# Patient Record
Sex: Female | Born: 1975 | Race: White | Hispanic: No | Marital: Single | Smoking: Never smoker
Health system: Southern US, Community
[De-identification: ages and names within clinical notes are randomized; demographics above are authoritative.]

## PROBLEM LIST (undated history)

## (undated) DIAGNOSIS — D259 Leiomyoma of uterus, unspecified: Secondary | ICD-10-CM

## (undated) DIAGNOSIS — Z8742 Personal history of other diseases of the female genital tract: Secondary | ICD-10-CM

## (undated) DIAGNOSIS — N7011 Chronic salpingitis: Secondary | ICD-10-CM

## (undated) DIAGNOSIS — N83202 Unspecified ovarian cyst, left side: Secondary | ICD-10-CM

## (undated) DIAGNOSIS — M1990 Unspecified osteoarthritis, unspecified site: Secondary | ICD-10-CM

## (undated) HISTORY — PX: NO PAST SURGERIES: SHX2092

---

## 2015-01-07 ENCOUNTER — Encounter (HOSPITAL_COMMUNITY): Payer: Self-pay | Admitting: *Deleted

## 2015-01-07 ENCOUNTER — Emergency Department (HOSPITAL_COMMUNITY): Payer: Managed Care, Other (non HMO)

## 2015-01-07 ENCOUNTER — Emergency Department (HOSPITAL_COMMUNITY)
Admission: EM | Admit: 2015-01-07 | Discharge: 2015-01-07 | Disposition: A | Discharge status: Home / Self Care | Payer: Managed Care, Other (non HMO) | Attending: Emergency Medicine | Admitting: Emergency Medicine

## 2015-01-07 DIAGNOSIS — K5900 Constipation, unspecified: Secondary | ICD-10-CM | POA: Insufficient documentation

## 2015-01-07 DIAGNOSIS — D259 Leiomyoma of uterus, unspecified: Secondary | ICD-10-CM

## 2015-01-07 DIAGNOSIS — Z3202 Encounter for pregnancy test, result negative: Secondary | ICD-10-CM | POA: Diagnosis not present

## 2015-01-07 DIAGNOSIS — N83202 Unspecified ovarian cyst, left side: Secondary | ICD-10-CM

## 2015-01-07 DIAGNOSIS — R1031 Right lower quadrant pain: Secondary | ICD-10-CM | POA: Diagnosis present

## 2015-01-07 DIAGNOSIS — N8320 Unspecified ovarian cysts: Secondary | ICD-10-CM | POA: Diagnosis not present

## 2015-01-07 DIAGNOSIS — N83201 Unspecified ovarian cyst, right side: Secondary | ICD-10-CM

## 2015-01-07 LAB — URINALYSIS, ROUTINE W REFLEX MICROSCOPIC
Bilirubin Urine: NEGATIVE
GLUCOSE, UA: NEGATIVE mg/dL
KETONES UR: NEGATIVE mg/dL
Nitrite: NEGATIVE
PH: 5.5 (ref 5.0–8.0)
Protein, ur: NEGATIVE mg/dL
SPECIFIC GRAVITY, URINE: 1.012 (ref 1.005–1.030)
Urobilinogen, UA: 0.2 mg/dL (ref 0.0–1.0)

## 2015-01-07 LAB — CBC WITH DIFFERENTIAL/PLATELET
BASOS ABS: 0 10*3/uL (ref 0.0–0.1)
Basophils Relative: 0 % (ref 0–1)
EOS ABS: 0.2 10*3/uL (ref 0.0–0.7)
EOS PCT: 2 % (ref 0–5)
HEMATOCRIT: 39.5 % (ref 36.0–46.0)
Hemoglobin: 13 g/dL (ref 12.0–15.0)
LYMPHS PCT: 12 % (ref 12–46)
Lymphs Abs: 1.2 10*3/uL (ref 0.7–4.0)
MCH: 30.5 pg (ref 26.0–34.0)
MCHC: 32.9 g/dL (ref 30.0–36.0)
MCV: 92.7 fL (ref 78.0–100.0)
Monocytes Absolute: 0.8 10*3/uL (ref 0.1–1.0)
Monocytes Relative: 8 % (ref 3–12)
Neutro Abs: 8 10*3/uL — ABNORMAL HIGH (ref 1.7–7.7)
Neutrophils Relative %: 78 % — ABNORMAL HIGH (ref 43–77)
Platelets: 305 10*3/uL (ref 150–400)
RBC: 4.26 MIL/uL (ref 3.87–5.11)
RDW: 12.8 % (ref 11.5–15.5)
WBC: 10.3 10*3/uL (ref 4.0–10.5)

## 2015-01-07 LAB — COMPREHENSIVE METABOLIC PANEL
ALT: 19 U/L (ref 0–35)
ANION GAP: 8 (ref 5–15)
AST: 21 U/L (ref 0–37)
Albumin: 3.5 g/dL (ref 3.5–5.2)
Alkaline Phosphatase: 71 U/L (ref 39–117)
BUN: 14 mg/dL (ref 6–23)
CALCIUM: 9 mg/dL (ref 8.4–10.5)
CO2: 25 mmol/L (ref 19–32)
CREATININE: 0.76 mg/dL (ref 0.50–1.10)
Chloride: 105 mmol/L (ref 96–112)
GFR calc Af Amer: >90 mL/min (ref >90)
GFR calc non Af Amer: >90 mL/min (ref >90)
GLUCOSE: 99 mg/dL (ref 70–99)
POTASSIUM: 3.8 mmol/L (ref 3.5–5.1)
Sodium: 138 mmol/L (ref 135–145)
Total Bilirubin: 0.5 mg/dL (ref 0.3–1.2)
Total Protein: 6.7 g/dL (ref 6.0–8.3)

## 2015-01-07 LAB — POC URINE PREG, ED: PREG TEST UR: NEGATIVE

## 2015-01-07 LAB — URINE MICROSCOPIC-ADD ON

## 2015-01-07 LAB — LIPASE, BLOOD: Lipase: 35 U/L (ref 11–59)

## 2015-01-07 MED ORDER — IOHEXOL 300 MG/ML  SOLN
25.0000 mL | Freq: Once | INTRAMUSCULAR | Status: AC | PRN
  Administered 2015-01-07: 25 mL via ORAL

## 2015-01-07 MED ORDER — NAPROXEN 500 MG PO TABS: 500.0000 mg | ORAL_TABLET | Freq: Two times a day (BID) | ORAL | Status: DC | PRN

## 2015-01-07 MED ORDER — SODIUM CHLORIDE 0.9 % IV BOLUS (SEPSIS)
1000.0000 mL | Freq: Once | INTRAVENOUS | Status: AC
  Administered 2015-01-07: 1000 mL via INTRAVENOUS

## 2015-01-07 MED ORDER — MORPHINE SULFATE 4 MG/ML IJ SOLN
4.0000 mg | Freq: Once | INTRAMUSCULAR | Status: AC
  Administered 2015-01-07: 4 mg via INTRAVENOUS
  Filled 2015-01-07: qty 1

## 2015-01-07 MED ORDER — HYDROCODONE-ACETAMINOPHEN 5-325 MG PO TABS: 1.0000 | ORAL_TABLET | Freq: Four times a day (QID) | ORAL | Status: DC | PRN

## 2015-01-07 MED ORDER — IOHEXOL 300 MG/ML  SOLN
100.0000 mL | Freq: Once | INTRAMUSCULAR | Status: AC | PRN
  Administered 2015-01-07: 100 mL via INTRAVENOUS

## 2015-01-07 NOTE — ED Notes (Signed)
Patchy redness noted to R arm after morphine given.  No pruritis or hives.  PA notified.

## 2015-01-07 NOTE — Discharge Instructions (Signed)
Your abdominal pain is likely related to your ovarian cysts. Use naprosyn as directed as needed for pain, and norco as directed as needed for severe pain, but don't drive while taking norco. Follow up with OBGYN in 1 week. Return to the ER for changes or worsening symptoms.  Abdominal (belly) pain can be caused by many things. Your caregiver performed an examination and possibly ordered blood/urine tests and imaging (CT scan, x-rays, ultrasound). Many cases can be observed and treated at home after initial evaluation in the emergency department. Even though you are being discharged home, abdominal pain can be unpredictable. Therefore, you need a repeated exam if your pain does not resolve, returns, or worsens. Most patients with abdominal pain don't have to be admitted to the hospital or have surgery, but serious problems like appendicitis and gallbladder attacks can start out as nonspecific pain. Many abdominal conditions cannot be diagnosed in one visit, so follow-up evaluations are very important. SEEK IMMEDIATE MEDICAL ATTENTION IF YOU DEVELOP ANY OF THE FOLLOWING SYMPTOMS:  The pain does not go away or becomes severe.   A temperature above 101 develops.   Repeated vomiting occurs (multiple episodes).   The pain becomes localized to portions of the abdomen. The right side could possibly be appendicitis. In an adult, the left lower portion of the abdomen could be colitis or diverticulitis.   Blood is being passed in stools or vomit (bright red or black tarry stools).   Return also if you develop chest pain, difficulty breathing, dizziness or fainting, or become confused, poorly responsive, or inconsolable (young children).  The constipation stays for more than 4 days.   There is belly (abdominal) or rectal pain.   You do not seem to be getting better.     Abdominal Pain Many things can cause abdominal pain. Usually, abdominal pain is not caused by a disease and will improve without  treatment. It can often be observed and treated at home. Your health care provider will do a physical exam and possibly order blood tests and X-rays to help determine the seriousness of your pain. However, in many cases, more time must pass before a clear cause of the pain can be found. Before that point, your health care provider may not know if you need more testing or further treatment. HOME CARE INSTRUCTIONS  Monitor your abdominal pain for any changes. The following actions may help to alleviate any discomfort you are experiencing:  Only take over-the-counter or prescription medicines as directed by your health care provider.  Do not take laxatives unless directed to do so by your health care provider.  Try a clear liquid diet (broth, tea, or water) as directed by your health care provider. Slowly move to a bland diet as tolerated. SEEK MEDICAL CARE IF:  You have unexplained abdominal pain.  You have abdominal pain associated with nausea or diarrhea.  You have pain when you urinate or have a bowel movement.  You experience abdominal pain that wakes you in the night.  You have abdominal pain that is worsened or improved by eating food.  You have abdominal pain that is worsened with eating fatty foods.  You have a fever. SEEK IMMEDIATE MEDICAL CARE IF:   Your pain does not go away within 2 hours.  You keep throwing up (vomiting).  Your pain is felt only in portions of the abdomen, such as the right side or the left lower portion of the abdomen.  You pass bloody or black tarry stools. MAKE  SURE YOU:  Understand these instructions.   Will watch your condition.   Will get help right away if you are not doing well or get worse.  Document Released: 07/09/2005 Document Revised: 10/04/2013 Document Reviewed: 06/08/2013 [facility] Patient Information 2015 [place], [place]. This information is not intended to replace advice given to you by your health care provider. Make sure you  discuss any questions you have with your health care provider.  Ovarian Cyst An ovarian cyst is a fluid-filled sac that forms on an ovary. The ovaries are small organs that produce eggs in women. Various types of cysts can form on the ovaries. Most are not cancerous. Many do not cause problems, and they often go away on their own. Some may cause symptoms and require treatment. Common types of ovarian cysts include:  Functional cysts--These cysts may occur every month during the menstrual cycle. This is normal. The cysts usually go away with the next menstrual cycle if the woman does not get pregnant. Usually, there are no symptoms with a functional cyst.  Endometrioma cysts--These cysts form from the tissue that lines the uterus. They are also called "chocolate cysts" because they become filled with blood that turns brown. This type of cyst can cause pain in the lower abdomen during intercourse and with your menstrual period.  Cystadenoma cysts--This type develops from the cells on the outside of the ovary. These cysts can get very big and cause lower abdomen pain and pain with intercourse. This type of cyst can twist on itself, cut off its blood supply, and cause severe pain. It can also easily rupture and cause a lot of pain.  Dermoid cysts--This type of cyst is sometimes found in both ovaries. These cysts may contain different kinds of body tissue, such as skin, teeth, hair, or cartilage. They usually do not cause symptoms unless they get very big.  Theca lutein cysts--These cysts occur when too much of a certain hormone (human chorionic gonadotropin) is produced and overstimulates the ovaries to produce an egg. This is most common after procedures used to assist with the conception of a baby (in vitro fertilization). CAUSES   Fertility drugs can cause a condition in which multiple large cysts are formed on the ovaries. This is called ovarian hyperstimulation syndrome.  A condition called  polycystic ovary syndrome can cause hormonal imbalances that can lead to nonfunctional ovarian cysts. SIGNS AND SYMPTOMS  Many ovarian cysts do not cause symptoms. If symptoms are present, they may include:  Pelvic pain or pressure.  Pain in the lower abdomen.  Pain during sexual intercourse.  Increasing girth (swelling) of the abdomen.  Abnormal menstrual periods.  Increasing pain with menstrual periods.  Stopping having menstrual periods without being pregnant. DIAGNOSIS  These cysts are commonly found during a routine or annual pelvic exam. Tests may be ordered to find out more about the cyst. These tests may include:  Ultrasound.  X-ray of the pelvis.  CT scan.  MRI.  Blood tests. TREATMENT  Many ovarian cysts go away on their own without treatment. Your health care provider may want to check your cyst regularly for 2-3 months to see if it changes. For women in menopause, it is particularly important to monitor a cyst closely because of the higher rate of ovarian cancer in menopausal women. When treatment is needed, it may include any of the following:  A procedure to drain the cyst (aspiration). This may be done using a long needle and ultrasound. It can also be done  through a laparoscopic procedure. This involves using a thin, lighted tube with a tiny camera on the end (laparoscope) inserted through a small incision.  Surgery to remove the whole cyst. This may be done using laparoscopic surgery or an open surgery involving a larger incision in the lower abdomen.  Hormone treatment or birth control pills. These methods are sometimes used to help dissolve a cyst. HOME CARE INSTRUCTIONS   Only take over-the-counter or prescription medicines as directed by your health care provider.  Follow up with your health care provider as directed.  Get regular pelvic exams and Pap tests. SEEK MEDICAL CARE IF:   Your periods are late, irregular, or painful, or they stop.  Your  pelvic pain or abdominal pain does not go away.  Your abdomen becomes larger or swollen.  You have pressure on your bladder or trouble emptying your bladder completely.  You have pain during sexual intercourse.  You have feelings of fullness, pressure, or discomfort in your stomach.  You lose weight for no apparent reason.  You feel generally ill.  You become constipated.  You lose your appetite.  You develop acne.  You have an increase in body and facial hair.  You are gaining weight, without changing your exercise and eating habits.  You think you are pregnant. SEEK IMMEDIATE MEDICAL CARE IF:   You have increasing abdominal pain.  You feel sick to your stomach (nauseous), and you throw up (vomit).  You develop a fever that comes on suddenly.  You have abdominal pain during a bowel movement.  Your menstrual periods become heavier than usual. MAKE SURE YOU:  Understand these instructions.  Will watch your condition.  Will get help right away if you are not doing well or get worse. Document Released: 09/29/2005 Document Revised: 10/04/2013 Document Reviewed: 06/06/2013 [facility] Patient Information 2015 [place], [place]. This information is not intended to replace advice given to you by your health care provider. Make sure you discuss any questions you have with your health care provider.  Fibroids Fibroids are lumps (tumors) that can occur any place in a woman's body. These lumps are not cancerous. Fibroids vary in size, weight, and where they grow. HOME CARE  Do not take aspirin.  Write down the number of pads or tampons you use during your period. Tell your doctor. This can help determine the best treatment for you. GET HELP RIGHT AWAY IF:  You have pain in your lower belly (abdomen) that is not helped with medicine.  You have cramps that are not helped with medicine.  You have more bleeding between or during your period.  You feel lightheaded or pass out  (faint).  Your lower belly pain gets worse. MAKE SURE YOU:  Understand these instructions.  Will watch your condition.  Will get help right away if you are not doing well or get worse. Document Released: 11/01/2010 Document Revised: 12/22/2011 Document Reviewed: 11/01/2010 [facility] Patient Information 2015 [place], [place]. This information is not intended to replace advice given to you by your health care provider. Make sure you discuss any questions you have with your health care provider.

## 2015-01-07 NOTE — ED Provider Notes (Signed)
CSN: [id number]     Arrival date & time 01/07/15  0707 History   First MD Initiated Contact with Patient 01/07/15 [id number]     Chief Complaint  Patient presents with  . Abdominal Pain     (Consider location/radiation/quality/duration/timing/severity/associated sxs/prior Treatment) HPI Comments: Hannah Miles is a 39 y.o. female with a PMHx of uterine fibroids and remote hx of PID, who presents to the ED with complaints of gradually worsening right lower quadrant abdominal pain that began 3 days ago. She reports the pain is 5/10 constantly dull and intermittently stabbing right lower quadrant abdominal pain, radiating into the right thigh, worse with palpation, bumps in the road, and laying on her right side, and improved with 800 mg ibuprofen which she took approximately 1-2 hours ago. States originally before the ibuprofen her pain was an 8/10. She reports that recently she was diagnosed with uterine fibroids, and that she is supposed to follow-up with her OB/GYN for a second ultrasound close to her menses in order to definitively rule out or rule in whether she has a right ovarian cyst or not. Additional symptoms include constipation, stating that she has not had a bowel movement since Friday which is atypical for her, as well as mild nausea. She denies any fevers, chills, chest pain, shortness of breath, vomiting, diarrhea, melena, hematochezia, obstipation, dysuria, hematuria, flank pain, back pain, vaginal bleeding or discharge, rashes, arthralgias, myalgias, numbness, tingling, weakness, sick contacts, recent travel, suspicious food intake, alcohol use, or chronic NSAID use. Denies any prior abdominal surgeries. Last menstrual period was 3/18-24, which she states was longer than normal, but no heavier and no more painful than normal.  Patient is a 39 y.o. female presenting with abdominal pain. The history is provided by the patient. No language interpreter was used.  Abdominal Pain Pain location:   RLQ Pain quality: dull (constantly) and stabbing (intermittently )   Pain radiates to:  R leg Pain severity:  Moderate Onset quality:  Gradual Duration:  3 days Timing:  Constant Progression:  Worsening Chronicity:  New Context: not alcohol use, not previous surgeries, not recent travel, not sick contacts, not suspicious food intake and not trauma   Relieved by:  NSAIDs Worsened by:  Position changes and palpation (laying on R side, bumps in the road) Ineffective treatments:  None tried Associated symptoms: constipation (no BM in 2 days) and nausea   Associated symptoms: no chest pain, no chills, no diarrhea, no dysuria, no fever, no flatus, no hematemesis, no hematochezia, no hematuria, no melena, no shortness of breath, no vaginal bleeding, no vaginal discharge and no vomiting   Risk factors: obesity   Risk factors: no alcohol abuse, has not had multiple surgeries and no NSAID use     No past medical history on file. No past surgical history on file. No family history on file. History  Substance Use Topics  . Smoking status: Not on file  . Smokeless tobacco: Not on file  . Alcohol Use: Not on file   OB History    No data available     Review of Systems  Constitutional: Negative for fever and chills.  Respiratory: Negative for shortness of breath.   Cardiovascular: Negative for chest pain.  Gastrointestinal: Positive for nausea, abdominal pain and constipation (no BM in 2 days). Negative for vomiting, diarrhea, blood in stool, melena, hematochezia, rectal pain, flatus and hematemesis.  Genitourinary: Negative for dysuria, hematuria, flank pain, vaginal bleeding, vaginal discharge and menstrual problem.  Musculoskeletal: Negative for myalgias,  back pain and arthralgias.  Skin: Negative for rash.  Allergic/Immunologic: Negative for immunocompromised state.  Neurological: Negative for weakness and numbness.  Psychiatric/Behavioral: Negative for confusion.   10 Systems  reviewed and are negative for acute change except as noted in the HPI.    Allergies  Review of patient's allergies indicates not on file.  Home Medications   Prior to Admission medications   Not on File   BP 113/51 mmHg  Pulse 73  Temp(Src) 98.3 F (36.8 C) (Oral)  Resp 16  Ht 5\' 2"  (1.575 m)  Wt 198 lb (89.812 kg)  BMI 36.21 kg/m2  SpO2 100% Physical Exam  Constitutional: She is oriented to person, place, and time. Vital signs are normal. She appears well-developed and well-nourished.  Non-toxic appearance. No distress.  Afebrile, nontoxic, NAD  HENT:  Head: Normocephalic and atraumatic.  Mouth/Throat: Oropharynx is clear and moist and mucous membranes are normal.  Eyes: Conjunctivae and EOM are normal. Right eye exhibits no discharge. Left eye exhibits no discharge.  Neck: Normal range of motion. Neck supple.  Cardiovascular: Normal rate, regular rhythm, normal heart sounds and intact distal pulses.  Exam reveals no gallop and no friction rub.   No murmur heard. Pulmonary/Chest: Effort normal and breath sounds normal. No respiratory distress. She has no decreased breath sounds. She has no wheezes. She has no rhonchi. She has no rales.  Abdominal: Soft. Normal appearance and bowel sounds are normal. She exhibits no distension. There is tenderness in the right lower quadrant. There is tenderness at McBurney's point. There is no rigidity, no rebound, no guarding, no CVA tenderness and negative Murphy's sign.    Soft, nondistended, +BS throughout, with TTP in RLQ over mcburney's point, no r/g/r, neg murphy's, neg rovsing's, neg psoas sign, neg foot tap test, no CVA TTP   Musculoskeletal: Normal range of motion.  MAE x4, strength and sensation grossly intact  Neurological: She is alert and oriented to person, place, and time. She has normal strength. No sensory deficit.  Skin: Skin is warm, dry and intact. No rash noted.  Psychiatric: She has a normal mood and affect.  Nursing  note and vitals reviewed.   ED Course  Procedures (including critical care time) Labs Review Labs Reviewed  CBC WITH DIFFERENTIAL/PLATELET - Abnormal; Notable for the following:    Neutrophils Relative % 78 (*)    Neutro Abs 8.0 (*)    All other components within normal limits  URINALYSIS, ROUTINE W REFLEX MICROSCOPIC - Abnormal; Notable for the following:    APPearance CLOUDY (*)    Hgb urine dipstick SMALL (*)    Leukocytes, UA LARGE (*)    All other components within normal limits  URINE MICROSCOPIC-ADD ON - Abnormal; Notable for the following:    Squamous Epithelial / LPF FEW (*)    Bacteria, UA FEW (*)    All other components within normal limits  URINE CULTURE  COMPREHENSIVE METABOLIC PANEL  LIPASE, BLOOD  POC URINE PREG, ED    Imaging Review Ct Abdomen Pelvis W Contrast  01/07/2015   CLINICAL DATA:  Right lower quadrant pain starting 01/04/2015  EXAM: CT ABDOMEN AND PELVIS WITH CONTRAST  TECHNIQUE: Multidetector CT imaging of the abdomen and pelvis was performed using the standard protocol following bolus administration of intravenous contrast.  CONTRAST:  185mL OMNIPAQUE IOHEXOL 300 MG/ML  SOLN  COMPARISON:  None.  FINDINGS: Lung bases are unremarkable. Sagittal images of the spine are unremarkable.  Enhanced liver is unremarkable. No calcified gallstones  are noted within gallbladder. Enhanced pancreas, spleen and adrenal glands are unremarkable.  Enhanced kidneys are symmetrical in size. No hydronephrosis or hydroureter. No aortic aneurysm.  Delayed renal images shows bilateral renal symmetrical excretion. Bilateral visualized proximal ureter is unremarkable.  There is no small bowel obstruction. No ascites or free air. No adenopathy.  No thickened or dilated small bowel loops. There is no pericecal inflammation. Normal retrocecal appendix noted in axial image 54.  There is mild nodular prominence of left anterior uterus fundus measures 3.4 cm highly suspicious for a myometrial  fibroid. Correlation with clinical exam and pelvic ultrasound is recommended. There is a left ovarian cyst measures 5 x 3.9 cm. Small amount of free fluid noted within posterior cul-de-sac. There might be a hemorrhagic follicle within right ovary measures 1.9 cm. There is tubular dilatation of the right fallopian tube measures at least 2.7 cm. 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 Minimal enhancement of right tubal wall. Mild salpingitis cannot be excluded. Clinical correlation is necessary.  No distal colonic obstruction.  IMPRESSION: 1. No acute inflammatory process within abdomen. 2. No small bowel obstruction.  No hydronephrosis or hydroureter. 3. Normal appendix.  No pericecal inflammation. 4. There is mild prominence of left uterine fundus measures 3.4 cm highly suspicious for myometrial fibroid. Correlation with clinical exam and pelvic ultrasound is recommended. 5. There is a left ovarian cyst measures 3.9 x 5 cm. Small amount of free fluid noted posterior cul-de-sac. There is tubular dilatation of right fallopian tube up to 2.7 cm. This is highly suspicious for hydrosalpinx. Minimal prominence of right fallopian wall. Mild salpingitis cannot be excluded. Clinical correlation is necessary. Question hemorrhagic follicle within right ovary measures 1.9 cm.   Electronically Signed   By: Lahoma Crocker M.D.   On: 01/07/2015 09:49     EKG Interpretation None      MDM   Final diagnoses:  RLQ abdominal pain  Cysts of both ovaries  Uterine leiomyoma, unspecified location    39 y.o. female here with RLQ abd pain, gradually worsening since 3 days ago. Diagnosed with fibroids, unsure if she also has ovarian cyst, scheduled for U/S next month. Exam shows RLQ TTP without peritoneal signs, neg foot tap or psoas sign, neg rovsing's, but pain is higher up than pelvic brim, and directly over McBurney's point. Will obtain labs, urine samples, and CT abd to eval for appendicitis vs ovarian cyst vs  obstruction vs other etiology for pain. Pt states she just took ibuprofen, and declines pain meds at this time. Declines nausea meds. Advised pt to let staff know if she needed nausea/pain medications at any time. Will reassess shortly.   10:04 AM Upreg neg, U/A contaminated with leuks, 11-20 WBC, but few bacteria. No UTI symptoms, will send for culture but will not treat today. CBC and CMP WNL. Lipase WNL. CT showing fibroids, L ovarian cyst, R fallopian tube dilation up to 2.7cm, and possible hemorrhagic follicle in R ovary measuring 1.9cm. Pt has hx of PID therefore fallopian tube finding likely from that, no s/sx of PID otherwise. VSS stable, doubt need for emergent intervention of cysts. Tolerating PO well here. Discussed NSAIDs, will give small supply of narcotic pain control, and will have her f/up with GYN. I explained the diagnosis and have given explicit precautions to return to the ER including for any other new or worsening symptoms. The patient understands and accepts the medical plan as it's been dictated and I have answered their questions. Discharge instructions concerning  home care and prescriptions have been given. The patient is STABLE and is discharged to home in good condition.  BP 111/66 mmHg  Pulse 64  Temp(Src) 98.3 F (36.8 C) (Oral)  Resp 18  Ht 5\' 2"  (1.575 m)  Wt 198 lb (89.812 kg)  BMI 36.21 kg/m2  SpO2 98%  LMP 12/29/2014  Meds ordered this encounter  Medications  . sodium chloride 0.9 % bolus 1,000 mL    Sig:   . iohexol (OMNIPAQUE) 300 MG/ML solution 25 mL    Sig:   . iohexol (OMNIPAQUE) 300 MG/ML solution 100 mL    Sig:   . morphine 4 MG/ML injection 4 mg    Sig:   . naproxen (NAPROSYN) 500 MG tablet    Sig: Take 1 tablet (500 mg total) by mouth 2 (two) times daily as needed for mild pain, moderate pain or headache (TAKE WITH MEALS.).    Dispense:  20 tablet    Refill:  0    Order Specific Question:  Supervising Provider    Answer:  MILLER, BRIAN [[id number]]   . HYDROcodone-acetaminophen (NORCO) 5-325 MG per tablet    Sig: Take 1 tablet by mouth every 6 (six) hours as needed for severe pain.    Dispense:  10 tablet    Refill:  0    Order Specific Question:  Supervising Provider    Answer:  Noemi Chapel [[id number]]     Carold Eisner Camprubi-Soms, PA-C 01/07/15 1009  Elnora Morrison, MD 01/08/15 [phone]

## 2015-01-07 NOTE — ED Notes (Signed)
Pt aware of urine sample needed. Pt unable to go at this time. 

## 2015-01-07 NOTE — ED Notes (Signed)
PT here for increasing RLQ pain since Thurs.  States recently dx with fibroids and OBGYN ordered 2nd [place] for poss ovarian cyst to R ovary.  Pt states no bm since Fri, otherwise no changes in bowel or bladder habits.  Some nausea noted.

## 2015-01-07 NOTE — ED Notes (Signed)
No swelling, hives noted at this time.

## 2015-01-07 NOTE — ED Notes (Signed)
CT called and notified pt has finished drinking contrast.

## 2015-01-08 LAB — URINE CULTURE
Colony Count: 60000
Special Requests: NORMAL

## 2015-06-01 ENCOUNTER — Ambulatory Visit (INDEPENDENT_AMBULATORY_CARE_PROVIDER_SITE_OTHER): Payer: Worker's Compensation | Admitting: Family Medicine

## 2015-06-01 VITALS — BP 120/84 | HR 66 | Temp 98.1°F | Resp 16 | Ht 64.0 in | Wt 210.0 lb

## 2015-06-01 DIAGNOSIS — Z3200 Encounter for pregnancy test, result unknown: Secondary | ICD-10-CM

## 2015-06-01 DIAGNOSIS — S29009A Unspecified injury of muscle and tendon of unspecified wall of thorax, initial encounter: Secondary | ICD-10-CM

## 2015-06-01 DIAGNOSIS — S29019A Strain of muscle and tendon of unspecified wall of thorax, initial encounter: Secondary | ICD-10-CM

## 2015-06-01 LAB — POCT URINE PREGNANCY: PREG TEST UR: NEGATIVE

## 2015-06-01 MED ORDER — CYCLOBENZAPRINE HCL 10 MG PO TABS: 10.0000 mg | ORAL_TABLET | Freq: Two times a day (BID) | ORAL | Status: DC | PRN

## 2015-06-01 MED ORDER — IBUPROFEN 800 MG PO TABS: 800.0000 mg | ORAL_TABLET | Freq: Three times a day (TID) | ORAL | Status: DC | PRN

## 2015-06-01 MED ORDER — TRAMADOL HCL 50 MG PO TABS: 50.0000 mg | ORAL_TABLET | Freq: Three times a day (TID) | ORAL | Status: DC | PRN

## 2015-06-01 NOTE — Progress Notes (Signed)
Hannah Miles 1976/08/02 39 y.o.   Chief Complaint  Patient presents with  . Back Injury    upper back on left side      Date of Injury: 05/31/15  History of Present Illness:  Presents for evaluation of work-related complaint. 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 The keg started to tip over, and she grabbed it so it would not fall.  She thought that she was ok, then started to feel a little sore in her back.  She took ibuprofen before bed, woke up around 0300 with pain again, now worse.   The pain is at the left side of her bra line.   No pain or numbness in her arms. Never had any back issues, no history of surgery.  She has NO allergy issues with ibuprofen- tolerates this well ROS  Otherwise well  Allergies  Allergen Reactions  . Aspirin Hives and Swelling    Chest tightness     Current medications reviewed and updated. Past medical history, family history, social history have been reviewed and updated.  LMP 05/09/15   Physical Exam Filed Vitals:   06/01/15 1246  Height: 5\' 4"  (1.626 m)  Weight: 210 lb (95.255 kg)   GEN: WDWN, NAD, Non-toxic, A & O x 3, looks well HEENT: Atraumatic, Normocephalic. Neck supple. No masses, No LAD. Ears and Nose: No external deformity. CV: RRR, No M/G/R. No JVD. No thrill. No extra heart sounds. PULM: CTA B, no wheezes, crackles, rhonchi. No retractions. No resp. distress. No accessory muscle use.Marland Kitchen EXTR: No c/c/e NEURO Normal gait.  PSYCH: Normally interactive. Conversant. Not depressed or anxious appearing.  Calm demeanor.  She has significatn tenderness and spasm in the left thoracic area, no redness, lesion, or cellulitis Normal strength and ROM of BUE.  Normal motion of her spine  Results for orders placed or performed in visit on 06/01/15  POCT urine pregnancy  Result  Value Ref Range   Preg Test, Ur Negative Negative    Assessment and Plan: Thoracic myofascial strain, initial encounter - Plan: traMADol (ULTRAM) 50 MG tablet, cyclobenzaprine (FLEXERIL) 10 MG tablet, ibuprofen (ADVIL,MOTRIN) 800 MG tablet  Encounter for confirmation of pregnancy test result with physical examination - Plan: POCT urine pregnancy  Treat for thoracic strain with ibuprofen 800 and flexeril as needed- also did give an rx for tramadol to use if needed for more severe pain She is cautioned not to combine flexeril with tramadol unless absolutely necessary, and that these medications can cause drowsiness She is taken OOW, and will see [place] on Monday for a recheck of her injury- sooner if any concerns

## 2015-06-01 NOTE — Patient Instructions (Addendum)
We are going to treat you for a thoracic strain with heat, rest, medication Use the flexeril as needed for tight muscles, and tramadol for pain if needed.  You can also use the ibuprofen instead  Both of these medications can make you a bit sleepy- be especially careful if you take them in combination

## 2015-06-04 ENCOUNTER — Ambulatory Visit: Payer: Worker's Compensation | Admitting: Family Medicine

## 2015-06-04 ENCOUNTER — Ambulatory Visit: Payer: Worker's Compensation

## 2015-06-04 ENCOUNTER — Encounter: Payer: Self-pay | Admitting: Family Medicine

## 2015-06-04 VITALS — BP 128/78 | HR 77 | Temp 98.7°F | Resp 16 | Ht 63.0 in | Wt 210.0 lb

## 2015-06-04 DIAGNOSIS — S29019D Strain of muscle and tendon of unspecified wall of thorax, subsequent encounter: Secondary | ICD-10-CM

## 2015-06-04 NOTE — Progress Notes (Signed)
Hannah Miles 1976-02-22 39 y.o.   Chief Complaint  Patient presents with  . Follow-up  . Back Pain     Date of Injury: 05/31/2015  History of Present Illness:  Presents for evaluation of work-related complaint. 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  She was having some pain in her thoracic back, more on the left- we have been treating her with medications including: ibuprofen 800, tramadol, flexeril  At night she takes a flexeril and tramadol.  During the day she is taking ibuprofen, and "if it gets too bad I take the tramadol and the flexeril and I go to sleep." she is sleeping a lot right now as that is when she feels best.   She is using some heat, and epson salts bath.    She is having maybe more pain now- "the only time I'm comfortable is when I'm asleep."  The pain is still mostly in her left upper back No sx down her arm, no numbness or weakness   ROS  Otherwise she is well.    Allergies  Allergen Reactions  . Aspirin Hives and Swelling    Chest tightness     Current medications reviewed and updated. Past medical history, family history, social history have been reviewed and updated.   Physical Exam  GEN: WDWN, NAD, Non-toxic, A & O x 3, looks well HEENT: Atraumatic, Normocephalic. Neck supple. No masses, No LAD. Ears and Nose: No external deformity. CV: RRR, No M/G/R. No JVD. No thrill. No extra heart sounds. PULM: CTA B, no wheezes, crackles, rhonchi. No retractions. No resp. distress. No accessory muscle use. EXTR: No c/c/e NEURO Normal gait.  PSYCH: Normally interactive. Conversant. Not depressed or anxious appearing.  Calm demeanor.  She continues to have tenderness to the left of the thoracic spine adjacent to the scapula Normal BUE strength but she has pain with strength testing of the left arm (pain in her back with use of arm.)  No winging of scapula, no rash, lesion or muscle wasting   [facility] reading  (PRIMARY) by  Dr. Lorelei Pont. Thoracic spine: negative THORACIC SPINE 2 VIEWS  COMPARISON: CT abdomen/ pelvis 01/07/2015  FINDINGS: There is no evidence of thoracic spine fracture. Alignment is normal. No other significant bone abnormalities are identified.  IMPRESSION: Negative.  Assessment and Plan: Thoracic myofascial strain, subsequent encounter - Plan: DG Thoracic Spine 2 View  Here today to recheck WC injury.  She is still hurt, cannot lift and has pain with ADls such as getting dressed.  She is also taking sedating medications.  Continue to have her OOW, she will rtc  For a recheck in 3 days and we hope she will be making good progress at that time

## 2015-06-04 NOTE — Patient Instructions (Signed)
Continue your medications as needed.   Use heat, rest, stretches, and let me know if you are getting worse or have any other concerns Please come and see me on Thursday (at the walk in center, I will be there 8-5) for a recheck We hope that you will be making good progress by then!

## 2015-06-07 ENCOUNTER — Ambulatory Visit (INDEPENDENT_AMBULATORY_CARE_PROVIDER_SITE_OTHER): Payer: Worker's Compensation | Admitting: Family Medicine

## 2015-06-07 VITALS — BP 112/82 | HR 66 | Temp 97.9°F | Resp 18 | Ht 64.0 in | Wt 211.0 lb

## 2015-06-07 DIAGNOSIS — S29019D Strain of muscle and tendon of unspecified wall of thorax, subsequent encounter: Secondary | ICD-10-CM

## 2015-06-07 DIAGNOSIS — S29009D Unspecified injury of muscle and tendon of unspecified wall of thorax, subsequent encounter: Secondary | ICD-10-CM

## 2015-06-07 NOTE — Progress Notes (Signed)
Hannah Miles 12/19/1975 39 y.o.   Chief Complaint  Patient presents with  . Follow-up    back   . Back Injury     Date of Injury: 05/31/2015  History of Present Illness:  Presents for evaluation of work-related complaint.  She injured her upper back at work while trying to prevent a keg from falling on 8/18.  We have seen her twice so far.  At her last visit this past Monday 8/22 she was not doing any better, but xrays were negative as below  THORACIC SPINE 2 VIEWS  COMPARISON: CT abdomen/ pelvis 01/07/2015  FINDINGS: There is no evidence of thoracic spine fracture. Alignment is normal. No other significant bone abnormalities are identified.  IMPRESSION: Negative.  She did have a massage since her last visit which helped a lot.  She noted improvement since her massage.   At this time she feels that she is about 80% improvement.  She is able to do her ADLs again without difficulty.   ROS    Allergies  Allergen Reactions  . Aspirin Hives and Swelling    Chest tightness     Current medications reviewed and updated. Past medical history, family history, social history have been reviewed and updated.   Physical Exam  GEN: WDWN, NAD, Non-toxic, A & O x , overweight, looks well HEENT: Atraumatic, Normocephalic. Neck supple. No masses, No LAD. Ears and Nose: No external deformity. CV: RRR, No M/G/R. No JVD. No thrill. No extra heart sounds. PULM: CTA B, no wheezes, crackles, rhonchi. No retractions. No resp. distress. No accessory muscle use. EXTR: No c/c/e NEURO Normal gait.  PSYCH: Normally interactive. Conversant. Not depressed or anxious appearing.  Calm demeanor.  Formerly present left sided thoracic tenderness is resolved Normal BUE strength and ROM Normal back flexion and extension  Assessment and Plan: Thoracic myofascial strain, subsequent encounter she is much improved RTW at limited duty Recheck here in one week and anticipate she will be able  to do full duty

## 2015-06-07 NOTE — Patient Instructions (Signed)
I am glad that you are feeling better- please see [place] next Thursday for a recheck You can go back to work with restrictions- no lifting more than 30 lbs

## 2015-06-15 ENCOUNTER — Ambulatory Visit (INDEPENDENT_AMBULATORY_CARE_PROVIDER_SITE_OTHER): Payer: Worker's Compensation | Admitting: Physician Assistant

## 2015-06-15 VITALS — BP 120/80 | HR 66 | Temp 97.9°F | Resp 16 | Ht 64.0 in | Wt 213.0 lb

## 2015-06-15 DIAGNOSIS — S29009D Unspecified injury of muscle and tendon of unspecified wall of thorax, subsequent encounter: Secondary | ICD-10-CM | POA: Diagnosis not present

## 2015-06-15 DIAGNOSIS — S29019D Strain of muscle and tendon of unspecified wall of thorax, subsequent encounter: Secondary | ICD-10-CM

## 2015-06-15 MED ORDER — IBUPROFEN 800 MG PO TABS: ORAL_TABLET | ORAL | Status: DC

## 2015-06-17 NOTE — Progress Notes (Signed)
Hannah Miles 1975-12-30 39 y.o.   Chief Complaint  Patient presents with  . Work Related Injury    follow up     Date of Injury: 05/31/2015  History of Present Illness:  Presents for evaluation of work-related complaint.  She injured her upper back at work while trying to prevent a keg from falling on 8/18.  We have seen her three times so far.  At her last visit on she was doing much better.  THORACIC SPINE 2 VIEWS  COMPARISON: CT abdomen/ pelvis 01/07/2015  FINDINGS: There is no evidence of thoracic spine fracture. Alignment is normal. No other significant bone abnormalities are identified.  IMPRESSION: Negative.   Review of Systems  Constitutional: Negative for fever.  Respiratory: Negative for cough.   Cardiovascular: Negative for chest pain.  Gastrointestinal: Negative for nausea.  Musculoskeletal: Positive for myalgias.      Allergies  Allergen Reactions  . Aspirin Hives and Swelling    Chest tightness     Current medications reviewed and updated. Past medical history, family history, social history have been reviewed and updated.   Physical Exam  Blood pressure 120/80, pulse 66, temperature 97.9 F (36.6 C), temperature source Oral, resp. rate 16, height 5\' 4"  (1.626 m), weight 213 lb (96.616 kg), last menstrual period 06/04/2015, SpO2 98 %.   GEN: WDWN, NAD, Non-toxic, A & O x , overweight, looks well HEENT: Atraumatic, Normocephalic. Neck supple. No masses, No LAD. Ears and Nose: No external deformity. CV: RRR, No M/G/R. No JVD. No thrill. No extra heart sounds. PULM: CTA B, no wheezes, crackles, rhonchi. No retractions. No resp. distress. No accessory muscle use. EXTR: No c/c/e NEURO Normal gait.  PSYCH: Normally interactive. Conversant. Not depressed or anxious appearing.  Calm demeanor.  Formerly present left sided thoracic tenderness is resolved Normal BUE strength and ROM Normal back flexion and extension    Assessment and  Plan: Thoracic myofascial strain, subsequent encounter - Plan: ibuprofen (ADVIL,MOTRIN) 800 MG tablet Release to full duty without restriction.  Follow up as needed.

## 2015-08-05 NOTE — Progress Notes (Signed)
  Medical screening examination/treatment/procedure(s) were performed by non-physician practitioner and as supervising physician I was immediately available for consultation/collaboration.     

## 2015-11-16 ENCOUNTER — Other Ambulatory Visit: Payer: Self-pay | Admitting: Obstetrics and Gynecology

## 2015-11-16 DIAGNOSIS — R928 Other abnormal and inconclusive findings on diagnostic imaging of breast: Secondary | ICD-10-CM

## 2015-11-22 ENCOUNTER — Ambulatory Visit
Admission: RE | Admit: 2015-11-22 | Discharge: 2015-11-22 | Disposition: A | Discharge status: Home / Self Care | Payer: Managed Care, Other (non HMO) | Source: Ambulatory Visit | Attending: Obstetrics and Gynecology | Admitting: Obstetrics and Gynecology

## 2015-11-22 DIAGNOSIS — R928 Other abnormal and inconclusive findings on diagnostic imaging of breast: Secondary | ICD-10-CM

## 2016-06-11 ENCOUNTER — Encounter (HOSPITAL_BASED_OUTPATIENT_CLINIC_OR_DEPARTMENT_OTHER): Payer: Self-pay | Admitting: *Deleted

## 2016-06-11 NOTE — Progress Notes (Signed)
NPO AFTER MN.  ARRIVE AT 0600.  NEEDS URINE PREG.  GETTING CBC DONE Thursday 06-12-2016.

## 2016-06-12 DIAGNOSIS — E669 Obesity, unspecified: Secondary | ICD-10-CM | POA: Diagnosis not present

## 2016-06-12 DIAGNOSIS — N7011 Chronic salpingitis: Secondary | ICD-10-CM | POA: Diagnosis not present

## 2016-06-12 DIAGNOSIS — R1909 Other intra-abdominal and pelvic swelling, mass and lump: Secondary | ICD-10-CM | POA: Diagnosis present

## 2016-06-12 DIAGNOSIS — D259 Leiomyoma of uterus, unspecified: Secondary | ICD-10-CM | POA: Diagnosis not present

## 2016-06-12 DIAGNOSIS — Z6837 Body mass index (BMI) 37.0-37.9, adult: Secondary | ICD-10-CM | POA: Diagnosis not present

## 2016-06-12 DIAGNOSIS — N736 Female pelvic peritoneal adhesions (postinfective): Secondary | ICD-10-CM | POA: Diagnosis not present

## 2016-06-12 DIAGNOSIS — M1710 Unilateral primary osteoarthritis, unspecified knee: Secondary | ICD-10-CM | POA: Diagnosis not present

## 2016-06-12 LAB — CBC
HEMATOCRIT: 41.3 % (ref 36.0–46.0)
Hemoglobin: 13.7 g/dL (ref 12.0–15.0)
MCH: 30.2 pg (ref 26.0–34.0)
MCHC: 33.2 g/dL (ref 30.0–36.0)
MCV: 91.2 fL (ref 78.0–100.0)
PLATELETS: 309 10*3/uL (ref 150–400)
RBC: 4.53 MIL/uL (ref 3.87–5.11)
RDW: 13 % (ref 11.5–15.5)
WBC: 6.7 10*3/uL (ref 4.0–10.5)

## 2016-06-16 ENCOUNTER — Encounter (HOSPITAL_BASED_OUTPATIENT_CLINIC_OR_DEPARTMENT_OTHER): Payer: Self-pay | Admitting: Anesthesiology

## 2016-06-16 NOTE — H&P (Signed)
Hannah Miles is an 40 y.o. female. She has been followed for several months by Dr. Ulanda Edison for a 5-6 cm left adnexal mass which appears to be an endometrioma, as well as a serpiginous area on the right c/w possible hydrosalpinx, and several small intramural myomas.  She does not want to remove tho option of fertility, but would like the endometrioma removed.    Pertinent Gynecological History: Last mammogram: abnormal: right breast mass which appears benign Date: 11/2015 Last pap: normal Date: 11/2015 OB History: G1, P0010   Menstrual History: Patient's last menstrual period was 05/31/2016 (exact date).    Past Medical History:  Diagnosis Date  . Arthritis    KNEE  . Cyst of left ovary   . History of pelvic inflammatory disease   . 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 Uterine fibroid     Past Surgical History:  Procedure Laterality Date  . NO PAST SURGERIES      History reviewed. No pertinent family history.  Social History:  reports that she has never smoked. She has never used smokeless tobacco. She reports that she drinks alcohol. She reports that she does not use drugs.  Allergies:  Allergies  Allergen Reactions  . Aspirin Anaphylaxis, Hives and Swelling    Chest tightness    No prescriptions prior to admission.    Review of Systems  Respiratory: Negative.   Cardiovascular: Negative.   Gastrointestinal: Negative.   Genitourinary: Negative.     Height 5\' 4"  (1.626 m), weight 97.5 kg (215 lb), last menstrual period 05/31/2016. Physical Exam  Constitutional: She appears well-developed and well-nourished.  Cardiovascular: Normal rate, regular rhythm and normal heart sounds.   No murmur heard. Respiratory: Effort normal and breath sounds normal. No respiratory distress. She has no wheezes.  GI: Soft.  Genitourinary: Vagina normal.  Genitourinary Comments: Uterus slightly enlarged and irregular Left adnexal fullness, no mass on right    No results found for this  or any previous visit (from the past 24 hour(s)).  No results found.  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  All medical and surgical options have been discussed.  Surgical procedure and risks, as well as chances of relieving her symptoms have all been discussed.  She wants to preserve chances for fertility if possible.  Will admit for open laparoscopy with probable left ovarian cystectomy, possible LSO, possible right tuboplasty.    Thomasena Vandenheuvel D 06/16/2016, 8:26 PM

## 2016-06-16 NOTE — Anesthesia Preprocedure Evaluation (Addendum)
Anesthesia Evaluation  Patient identified by MRN, date of birth, ID band Patient awake    Reviewed: Allergy & Precautions, NPO status , Patient's Chart, lab work & pertinent test results  Airway Mallampati: II  TM Distance: >3 FB Neck ROM: Full    Dental no notable dental hx. (+) Teeth Intact   Pulmonary neg pulmonary ROS,    Pulmonary exam normal breath sounds clear to auscultation       Cardiovascular negative cardio ROS Normal cardiovascular exam Rhythm:Regular Rate:Normal     Neuro/Psych negative neurological ROS  negative psych ROS   GI/Hepatic negative GI ROS, Neg liver ROS,   Endo/Other  Obesity  Renal/GU negative Renal ROS  negative genitourinary   Musculoskeletal  (+) Arthritis , Knee    Abdominal (+) + obese,   Peds  Hematology negative hematology ROS (+)   Anesthesia Other Findings   Reproductive/Obstetrics Left Ovarian cyst  Right Hydrosalphinx                            Lab Results  Component Value Date   WBC 6.7 06/12/2016   HGB 13.7 06/12/2016   HCT 41.3 06/12/2016   MCV 91.2 06/12/2016   PLT 309 06/12/2016    Anesthesia Physical Anesthesia Plan  ASA: II  Anesthesia Plan: General   Post-op Pain Management:    Induction: Intravenous  Airway Management Planned: Oral ETT  Additional Equipment:   Intra-op Plan:   Post-operative Plan: Extubation in OR  Informed Consent: I have reviewed the patients History and Physical, chart, labs and discussed the procedure including the risks, benefits and alternatives for the proposed anesthesia with the patient or authorized representative who has indicated his/her understanding and acceptance.   Dental advisory given  Plan Discussed with: CRNA, Anesthesiologist and Surgeon  Anesthesia Plan Comments:         Anesthesia Quick Evaluation

## 2016-06-17 ENCOUNTER — Encounter (HOSPITAL_BASED_OUTPATIENT_CLINIC_OR_DEPARTMENT_OTHER)
Admission: RE | Disposition: A | Discharge status: Home / Self Care | Payer: Self-pay | Source: Ambulatory Visit | Attending: Obstetrics and Gynecology

## 2016-06-17 ENCOUNTER — Ambulatory Visit (HOSPITAL_BASED_OUTPATIENT_CLINIC_OR_DEPARTMENT_OTHER)
Admission: RE | Admit: 2016-06-17 | Discharge: 2016-06-17 | Disposition: A | Discharge status: Home / Self Care | Payer: Managed Care, Other (non HMO) | Source: Ambulatory Visit | Attending: Obstetrics and Gynecology | Admitting: Obstetrics and Gynecology

## 2016-06-17 ENCOUNTER — Encounter (HOSPITAL_BASED_OUTPATIENT_CLINIC_OR_DEPARTMENT_OTHER): Payer: Self-pay | Admitting: *Deleted

## 2016-06-17 ENCOUNTER — Ambulatory Visit (HOSPITAL_BASED_OUTPATIENT_CLINIC_OR_DEPARTMENT_OTHER): Payer: Managed Care, Other (non HMO) | Admitting: Anesthesiology

## 2016-06-17 DIAGNOSIS — Z6837 Body mass index (BMI) 37.0-37.9, adult: Secondary | ICD-10-CM | POA: Insufficient documentation

## 2016-06-17 DIAGNOSIS — N736 Female pelvic peritoneal adhesions (postinfective): Secondary | ICD-10-CM | POA: Insufficient documentation

## 2016-06-17 DIAGNOSIS — M1710 Unilateral primary osteoarthritis, unspecified knee: Secondary | ICD-10-CM | POA: Insufficient documentation

## 2016-06-17 DIAGNOSIS — E669 Obesity, unspecified: Secondary | ICD-10-CM | POA: Insufficient documentation

## 2016-06-17 DIAGNOSIS — N7011 Chronic salpingitis: Secondary | ICD-10-CM | POA: Insufficient documentation

## 2016-06-17 DIAGNOSIS — D259 Leiomyoma of uterus, unspecified: Secondary | ICD-10-CM | POA: Insufficient documentation

## 2016-06-17 HISTORY — DX: Unspecified osteoarthritis, unspecified site: M19.90

## 2016-06-17 HISTORY — DX: Leiomyoma of uterus, unspecified: D25.9

## 2016-06-17 HISTORY — PX: LAPAROSCOPY: SHX197

## 2016-06-17 HISTORY — DX: Personal history of other diseases of the female genital tract: Z87.42

## 2016-06-17 HISTORY — DX: Unspecified ovarian cyst, left side: N83.202

## 2016-06-17 HISTORY — DX: Chronic salpingitis: N70.11

## 2016-06-17 HISTORY — PX: LAPAROSCOPIC LYSIS OF ADHESIONS: SHX5905

## 2016-06-17 LAB — POCT PREGNANCY, URINE: Preg Test, Ur: NEGATIVE

## 2016-06-17 SURGERY — LAPAROSCOPY OPERATIVE
Anesthesia: General | Site: Abdomen

## 2016-06-17 MED ORDER — FENTANYL CITRATE (PF) 100 MCG/2ML IJ SOLN
INTRAMUSCULAR | Status: DC | PRN
  Administered 2016-06-17: 50 ug via INTRAVENOUS
  Administered 2016-06-17: 100 ug via INTRAVENOUS
  Administered 2016-06-17: 25 ug via INTRAVENOUS
  Administered 2016-06-17: 50 ug via INTRAVENOUS

## 2016-06-17 MED ORDER — SUGAMMADEX SODIUM 200 MG/2ML IV SOLN
INTRAVENOUS | Status: AC
  Filled 2016-06-17: qty 2

## 2016-06-17 MED ORDER — LACTATED RINGERS IR SOLN
Status: DC | PRN
  Administered 2016-06-17: 3000 mL

## 2016-06-17 MED ORDER — ROCURONIUM BROMIDE 10 MG/ML (PF) SYRINGE
PREFILLED_SYRINGE | INTRAVENOUS | Status: DC | PRN
  Administered 2016-06-17 (×3): 10 mg via INTRAVENOUS
  Administered 2016-06-17: 20 mg via INTRAVENOUS
  Administered 2016-06-17: 50 mg via INTRAVENOUS

## 2016-06-17 MED ORDER — MIDAZOLAM HCL 5 MG/5ML IJ SOLN
INTRAMUSCULAR | Status: DC | PRN
  Administered 2016-06-17: 2 mg via INTRAVENOUS

## 2016-06-17 MED ORDER — HYDROMORPHONE HCL 1 MG/ML IJ SOLN
0.2500 mg | INTRAMUSCULAR | Status: DC | PRN
  Filled 2016-06-17: qty 1

## 2016-06-17 MED ORDER — ROCURONIUM BROMIDE 100 MG/10ML IV SOLN: INTRAVENOUS | Status: DC | PRN

## 2016-06-17 MED ORDER — MIDAZOLAM HCL 2 MG/2ML IJ SOLN
INTRAMUSCULAR | Status: AC
  Filled 2016-06-17: qty 2

## 2016-06-17 MED ORDER — BUPIVACAINE HCL (PF) 0.25 % IJ SOLN
INTRAMUSCULAR | Status: DC | PRN
  Administered 2016-06-17: 20 mL

## 2016-06-17 MED ORDER — ROCURONIUM BROMIDE 10 MG/ML (PF) SYRINGE
PREFILLED_SYRINGE | INTRAVENOUS | Status: AC
  Filled 2016-06-17: qty 10

## 2016-06-17 MED ORDER — FENTANYL CITRATE (PF) 250 MCG/5ML IJ SOLN
INTRAMUSCULAR | Status: AC
  Filled 2016-06-17: qty 5

## 2016-06-17 MED ORDER — PROPOFOL 10 MG/ML IV BOLUS
INTRAVENOUS | Status: DC | PRN
  Administered 2016-06-17: 200 mg via INTRAVENOUS

## 2016-06-17 MED ORDER — METOCLOPRAMIDE HCL 5 MG/ML IJ SOLN
INTRAMUSCULAR | Status: AC
Start: 2016-06-17 — End: 2016-06-17
  Filled 2016-06-17: qty 2

## 2016-06-17 MED ORDER — DEXAMETHASONE SODIUM PHOSPHATE 4 MG/ML IJ SOLN
INTRAMUSCULAR | Status: DC | PRN
  Administered 2016-06-17: 10 mg via INTRAVENOUS

## 2016-06-17 MED ORDER — METOCLOPRAMIDE HCL 5 MG/ML IJ SOLN
10.0000 mg | Freq: Once | INTRAMUSCULAR | Status: AC | PRN
  Administered 2016-06-17: 10 mg via INTRAVENOUS
  Filled 2016-06-17: qty 2

## 2016-06-17 MED ORDER — KETOROLAC TROMETHAMINE 30 MG/ML IJ SOLN
INTRAMUSCULAR | Status: AC
  Filled 2016-06-17: qty 1

## 2016-06-17 MED ORDER — SCOPOLAMINE 1 MG/3DAYS TD PT72
MEDICATED_PATCH | TRANSDERMAL | Status: AC
  Filled 2016-06-17: qty 1

## 2016-06-17 MED ORDER — SUGAMMADEX SODIUM 200 MG/2ML IV SOLN
INTRAVENOUS | Status: DC | PRN
  Administered 2016-06-17: 200 mg via INTRAVENOUS

## 2016-06-17 MED ORDER — PROPOFOL 10 MG/ML IV BOLUS
INTRAVENOUS | Status: AC
  Filled 2016-06-17: qty 40

## 2016-06-17 MED ORDER — DEXAMETHASONE SODIUM PHOSPHATE 10 MG/ML IJ SOLN
INTRAMUSCULAR | Status: AC
  Filled 2016-06-17: qty 1

## 2016-06-17 MED ORDER — HYDROCODONE-ACETAMINOPHEN 5-325 MG PO TABS: 1.0000 | ORAL_TABLET | Freq: Four times a day (QID) | ORAL | 0 refills | Status: AC | PRN

## 2016-06-17 MED ORDER — LIDOCAINE 2% (20 MG/ML) 5 ML SYRINGE
INTRAMUSCULAR | Status: AC
  Filled 2016-06-17: qty 5

## 2016-06-17 MED ORDER — LIDOCAINE 2% (20 MG/ML) 5 ML SYRINGE
INTRAMUSCULAR | Status: DC | PRN
  Administered 2016-06-17: 100 mg via INTRAVENOUS

## 2016-06-17 MED ORDER — ONDANSETRON HCL 4 MG/2ML IJ SOLN
INTRAMUSCULAR | Status: DC | PRN
  Administered 2016-06-17: 4 mg via INTRAVENOUS

## 2016-06-17 MED ORDER — LACTATED RINGERS IV SOLN
INTRAVENOUS | Status: DC
  Administered 2016-06-17 (×3): via INTRAVENOUS
  Filled 2016-06-17: qty 1000

## 2016-06-17 MED ORDER — MEPERIDINE HCL 25 MG/ML IJ SOLN
6.2500 mg | INTRAMUSCULAR | Status: DC | PRN
Start: 2016-06-17 — End: 2016-06-17
  Filled 2016-06-17: qty 1

## 2016-06-17 MED ORDER — SCOPOLAMINE 1 MG/3DAYS TD PT72
1.0000 | MEDICATED_PATCH | TRANSDERMAL | Status: DC
  Administered 2016-06-17: 1.5 mg via TRANSDERMAL
  Filled 2016-06-17: qty 1

## 2016-06-17 SURGICAL SUPPLY — 56 items
APPLICATOR COTTON TIP 6IN STRL (MISCELLANEOUS) ×4 IMPLANT
BALLN HASSAN TROCAR (MISCELLANEOUS) ×4 IMPLANT
BENZOIN TINCTURE PRP APPL 2/3 (GAUZE/BANDAGES/DRESSINGS) IMPLANT
BLADE SURG 15 STRL LF DISP TIS (BLADE) ×2 IMPLANT
BLADE SURG 15 STRL SS (BLADE) ×2
CANISTER SUCTION 1200CC (MISCELLANEOUS) IMPLANT
CANISTER SUCTION 2500CC (MISCELLANEOUS) ×4 IMPLANT
CATH ROBINSON RED A/P 16FR (CATHETERS) ×4 IMPLANT
CLOSURE WOUND 1/4X4 (GAUZE/BANDAGES/DRESSINGS)
DRAPE UNDERBUTTOCKS STRL (DRAPE) ×4 IMPLANT
DRSG COVADERM PLUS 2X2 (GAUZE/BANDAGES/DRESSINGS) ×8 IMPLANT
DRSG OPSITE POSTOP 3X4 (GAUZE/BANDAGES/DRESSINGS) IMPLANT
DRSG TELFA 3X8 NADH (GAUZE/BANDAGES/DRESSINGS) ×4 IMPLANT
DURAPREP 26ML APPLICATOR (WOUND CARE) ×4 IMPLANT
ELECT REM PT RETURN 9FT ADLT (ELECTROSURGICAL) ×4
ELECTRODE REM PT RTRN 9FT ADLT (ELECTROSURGICAL) ×2 IMPLANT
GLOVE BIO SURGEON STRL SZ8 (GLOVE) IMPLANT
GLOVE ORTHO TXT STRL SZ7.5 (GLOVE) ×4 IMPLANT
GLOVE SURG ORTHO 8.0 STRL STRW (GLOVE) ×8 IMPLANT
GOWN STRL REUS W/ TWL LRG LVL3 (GOWN DISPOSABLE) ×2 IMPLANT
GOWN STRL REUS W/ TWL XL LVL3 (GOWN DISPOSABLE) ×2 IMPLANT
GOWN STRL REUS W/TWL LRG LVL3 (GOWN DISPOSABLE) ×6 IMPLANT
GOWN STRL REUS W/TWL XL LVL3 (GOWN DISPOSABLE) ×6 IMPLANT
KIT ROOM TURNOVER WOR (KITS) ×4 IMPLANT
LIQUID BAND (GAUZE/BANDAGES/DRESSINGS) ×4 IMPLANT
MANIFOLD NEPTUNE II (INSTRUMENTS) IMPLANT
MANIPULATOR UTERINE 4.5 ZUMI (MISCELLANEOUS) IMPLANT
NEEDLE HYPO 25X1 1.5 SAFETY (NEEDLE) IMPLANT
NEEDLE INSUFFLATION 120MM (ENDOMECHANICALS) ×4 IMPLANT
NEEDLE INSUFFLATION 14GA 120MM (NEEDLE) ×4 IMPLANT
NEEDLE INSUFFLATION 14GA 150MM (NEEDLE) IMPLANT
NS IRRIG 500ML POUR BTL (IV SOLUTION) ×4 IMPLANT
PACK BASIN DAY SURGERY FS (CUSTOM PROCEDURE TRAY) ×4 IMPLANT
PACK LAPAROSCOPY II (CUSTOM PROCEDURE TRAY) ×4 IMPLANT
PAD OB MATERNITY 4.3X12.25 (PERSONAL CARE ITEMS) ×4 IMPLANT
PAD PREP 24X48 CUFFED NSTRL (MISCELLANEOUS) ×4 IMPLANT
PADDING ION DISPOSABLE (MISCELLANEOUS) ×4 IMPLANT
POUCH SPECIMEN RETRIEVAL 10MM (ENDOMECHANICALS) IMPLANT
SCISSORS LAP 5X35 DISP (ENDOMECHANICALS) IMPLANT
SET IRRIG TUBING LAPAROSCOPIC (IRRIGATION / IRRIGATOR) ×4 IMPLANT
SHEARS HARMONIC ACE PLUS 36CM (ENDOMECHANICALS) ×4 IMPLANT
SOLUTION ANTI FOG 6CC (MISCELLANEOUS) ×4 IMPLANT
SOLUTION ELECTROLUBE (MISCELLANEOUS) IMPLANT
STRIP CLOSURE SKIN 1/4X4 (GAUZE/BANDAGES/DRESSINGS) IMPLANT
SUT VICRYL 0 UR6 27IN ABS (SUTURE) ×4 IMPLANT
SUT VICRYL 4-0 PS2 18IN ABS (SUTURE) ×4 IMPLANT
SYR 3ML 23GX1 SAFETY (SYRINGE) IMPLANT
SYR CONTROL 10ML LL (SYRINGE) ×4 IMPLANT
SYRINGE 10CC LL (SYRINGE) ×4 IMPLANT
TOWEL OR 17X24 6PK STRL BLUE (TOWEL DISPOSABLE) ×8 IMPLANT
TRAY DSU PREP LF (CUSTOM PROCEDURE TRAY) ×4 IMPLANT
TROCAR XCEL NON-BLD 11X100MML (ENDOMECHANICALS) ×4 IMPLANT
TROCAR XCEL NON-BLD 5MMX100MML (ENDOMECHANICALS) ×4 IMPLANT
TUBING INSUFFLATION 10FT LAP (TUBING) ×4 IMPLANT
WARMER LAPAROSCOPE (MISCELLANEOUS) ×4 IMPLANT
WATER STERILE IRR 500ML POUR (IV SOLUTION) IMPLANT

## 2016-06-17 NOTE — Anesthesia Postprocedure Evaluation (Signed)
Anesthesia Post Note  Patient: Hannah Miles  Procedure(s) Performed: Procedure(s) (LRB): LAPAROSCOPY OPERATIVE (N/A) LAPAROSCOPIC LYSIS OF ADHESIONS (N/A)  Patient location during evaluation: PACU Anesthesia Type: General Level of consciousness: awake and alert Pain management: pain level controlled Vital Signs Assessment: post-procedure vital signs reviewed and stable Respiratory status: spontaneous breathing, nonlabored ventilation and respiratory function stable Cardiovascular status: blood pressure returned to baseline and stable Postop Assessment: no signs of nausea or vomiting Anesthetic complications: no    Last Vitals:  Vitals:   06/17/16 0930 06/17/16 0945  BP: 130/79 115/78  Pulse: 80 70  Resp: 13 17  Temp:      Last Pain:  Vitals:   06/17/16 0608  TempSrc: Oral                 Asmara Backs A.

## 2016-06-17 NOTE — Transfer of Care (Signed)
Immediate Anesthesia Transfer of Care Note  Patient: Hannah Miles  Procedure(s) Performed: Procedure(s): LAPAROSCOPY OPERATIVE (N/A) LAPAROSCOPIC LYSIS OF ADHESIONS (N/A)  Patient Location: PACU  Anesthesia Type:General  Level of Consciousness: awake, alert  and oriented  Airway & Oxygen Therapy: Patient Spontanous Breathing and Patient connected to nasal cannula oxygen  Post-op Assessment: Report given to RN  Post vital signs: Reviewed and stable  Last Vitals:130/79, 75, 15, 100%, 97.6  Vitals:   06/17/16 0608  BP: 124/76  Pulse: 70  Resp: 16  Temp: 36.4 C    Last Pain:  Vitals:   06/17/16 0608  TempSrc: Oral      Patients Stated Pain Goal: 7 ([phone] [phone])  Complications: No apparent anesthesia complications

## 2016-06-17 NOTE — Op Note (Signed)
Preoperative diagnosis: Left ovarian cyst, possible right hydrosalpinx Postoperative diagnosis: Same, significant pelvic adhesions, left hydrosalpinx Procedure: Open laparoscopy with significant adhesiolysis  Surgeon: Cheri Fowler M.D.  Assistant:  Crawford Givens, D.O. Anesthesia: Gen. Endotracheal tube  Findings: She had a normal upper abdomen, uterus with multiple 2-3 cm exophytic myomas, significant pelvic adhesions with dilated tubes, no significant pelvic mas   Specimens: None  Estimated blood loss: 0000000 Complications: None  Procedure in detail:   The patient was taken to the operating room and placed in the dorsosupine position. General anesthesia was induced. Her legs were placed in mobile stirrups and her left arm was tucked to her side. Abdomen perineum and vagina were then prepped and draped in the usual sterile fashion, bladder drained with a red Robinson catheter, an Acorn tenaculum was applied to the cervix for uterine manipulation. Infraumbilical skin was then infiltrated with quarter percent Marcaine and a 3 cm horizontal incision was made. Blunt dissection to the fascia was carried out, the fascia was grasped and elevated and incised with scissors.  Peritoneum was then grasped, elevated and entered sharply.  A pursestring suture of 0-Vicryl was placed around the fascia and a Hasson cannula was inserted and secured.  The abdomen was insufflated with CO2 and adequate visualization was achieved. A 5 mm port was then placed on the left  And right sides also under direct visualization. Careful and thorough inspection revealed the above-mentioned findings.  Using the Harmonic scalpel Ace, adhesions were taken down carefully to avoid any bowel.  There was bowel adherent to the posterior left uterus and in the posterior culdesac.  I called for a surgical consult to evaluate the feasibility of taking down the adhesions that involved bowel, but after 20 minutes with no response I decided to not  pursue that any further.  The pelvis was irrigated, no significant bleeding.  I was not able to identify what we thought pre-operatively was a large left ovarian cyst.  Since she was not having significant pain, I did not think it was reasonable to pursue removal of her left tube and ovary, and neither tube was amenable to tuboplasty.  The 5 mm ports were removed under direct visualization. All gas was allowed to deflate from the abdomen and the umbilical trocar was removed. The pursestring suture was tied with good fascial closure.  Skin incisions were then closed with subcuticular sutures of 4-0 Vicryl followed by Dermabond. The Acorn tenaculum was removed. The patient was taken down from stirrups. She was awakened in the operating room and taken to the recovery room in stable condition after tolerating the procedure well. Counts were correct and she had PAS hose on throughout the procedure.

## 2016-06-17 NOTE — Interval H&P Note (Signed)
History and Physical Interval Note:  06/17/2016 7:09 AM  Hannah Miles  has presented today for surgery, with the diagnosis of Left Ovarian Cyst and Right Hydrosalpinx  The various methods of treatment have been discussed with the patient and family. After consideration of risks, benefits and other options for treatment, the patient has consented to  Procedure(s): LAPAROSCOPIC OVARIAN CYSTECTOMY (Left) SALPINGO OOPHORECTOMY, POSSIBLE RIGHT TUBOPLASTY (Left) as a surgical intervention .  The patient's history has been reviewed, patient examined, no change in status, stable for surgery.  I have reviewed the patient's chart and labs.  Questions were answered to the patient's satisfaction.     Cherisse Carrell D

## 2016-06-17 NOTE — Discharge Instructions (Signed)
HOME CARE INSTRUCTIONS - LAPAROSCOPY  Wound Care: The bandaids or dressing which are placed over the skin openings may be removed the day after surgery. The incision should be kept clean and dry. The stitches do not need to be removed. Should the incision become sore, red, and swollen after the first week, check with your doctor.  Personal Hygiene: Shower the day after your procedure. Always wipe from front to back after elimination.   Activity: Do not drive or operate any equipment today. The effects of the anesthesia are still present and drowsiness may result. Rest today, not necessarily flat bed rest, just take it easy. You may resume your normal activity in one to three days or as instructed by your physician.  Sexual Activity: You resume sexual activity as indicated by your physician_________. If your laparoscopy was for a sterilization ( tubes tied ), continue current method of birth control until after your next period or ask for specific instructions from your doctor.  Diet: Eat a light diet as desired this evening. You may resume a regular diet tomorrow.  Return to Work: Two to three days or as indicated by your doctor.  Expectations After Surgery: Your surgery will cause vaginal drainage or spotting which may continue for 2-3 days. Mild abdominal discomfort or tenderness is not unusual and some shoulder pain may also be noted which can be relieved by lying flat in pain.  Call Your Doctor If these Occur:  Persistent or heavy bleeding at incision site       Redness or swelling around incision       Elevation of temperature greater than 100 degrees F  Call for follow-up appointment _____________.   Post Anesthesia Home Care Instructions  Activity: Get plenty of rest for the remainder of the day. A responsible adult should stay with you for 24 hours following the procedure.  For the next 24 hours, DO NOT: -Drive a car -Paediatric nurse -Drink alcoholic beverages -Take any  medication unless instructed by your physician -Make any legal decisions or sign important papers.  Meals: Start with liquid foods such as gelatin or soup. Progress to regular foods as tolerated. Avoid greasy, spicy, heavy foods. If nausea and/or vomiting occur, drink only clear liquids until the nausea and/or vomiting subsides. Call your physician if vomiting continues.  Special Instructions/Symptoms: Your throat may feel dry or sore from the anesthesia or the breathing tube placed in your throat during surgery. If this causes discomfort, gargle with warm salt water. The discomfort should disappear within 24 hours.  If you had a scopolamine patch placed behind your ear for the management of post- operative nausea and/or vomiting:  1. The medication in the patch is effective for 72 hours, after which it should be removed.  Wrap patch in a tissue and discard in the trash. Wash hands thoroughly with soap and water. 2. You may remove the patch earlier than 72 hours if you experience unpleasant side effects which may include dry mouth, dizziness or visual disturbances. 3. Avoid touching the patch. Wash your hands with soap and water after contact with the patch.   Routine instructions for laparoscopy

## 2016-06-17 NOTE — Anesthesia Procedure Notes (Signed)
Procedure Name: Intubation Date/Time: 06/17/2016 7:33 AM Performed by: Bethena Roys T Pre-anesthesia Checklist: Patient identified, Emergency Drugs available, Suction available and Patient being monitored Patient Re-evaluated:Patient Re-evaluated prior to inductionOxygen Delivery Method: Circle system utilized Preoxygenation: Pre-oxygenation with 100% oxygen Intubation Type: IV induction Ventilation: Mask ventilation without difficulty Laryngoscope Size: Mac and 3 Tube type: Oral Tube size: 7.0 mm Number of attempts: 1 Airway Equipment and Method: Stylet and Oral airway Placement Confirmation: ETT inserted through vocal cords under direct vision,  positive ETCO2 and breath sounds checked- equal and bilateral Secured at: 22 cm Tube secured with: Tape Dental Injury: Teeth and Oropharynx as per pre-operative assessment

## 2016-06-18 ENCOUNTER — Encounter (HOSPITAL_BASED_OUTPATIENT_CLINIC_OR_DEPARTMENT_OTHER): Payer: Self-pay | Admitting: Obstetrics and Gynecology

## 2016-09-17 IMAGING — CT CT ABD-PELV W/ CM
2 of 5 series · 15 of 46 positions shown, 17 images · IV contrast (Omni 300)
Comparison: None.

CLINICAL DATA: Right lower quadrant pain starting 01/04/2015

EXAM:
CT ABDOMEN AND PELVIS WITH CONTRAST
TECHNIQUE: Multidetector CT imaging of the abdomen and pelvis was performed
using the standard protocol following bolus administration of
intravenous contrast.
CONTRAST:  100mL OMNIPAQUE IOHEXOL 300 MG/ML  SOLN

[Series 2: abd/ pelvis 5.0 i30f 1 · axial · 0.87mm/px · z∈[+771,+1231]mm · 12 of 102 slices shown, 14 images]
[im 5/102  soft-tissue]
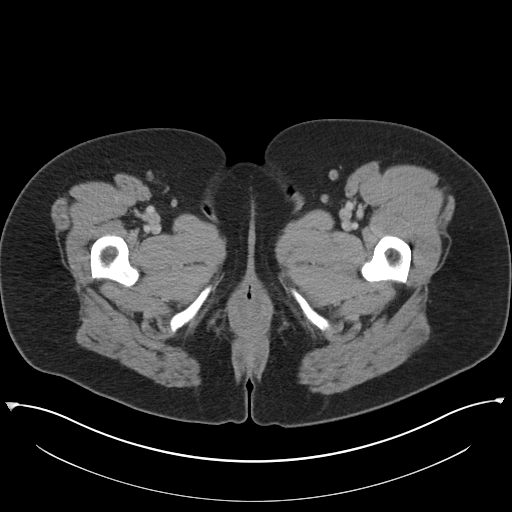
[im 5/102  bone]
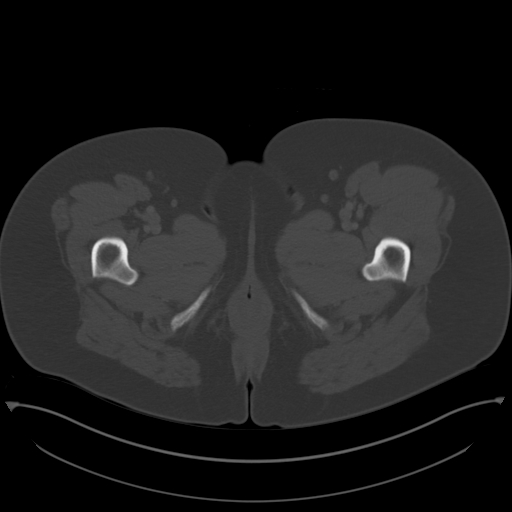
[im 15/102  soft-tissue]
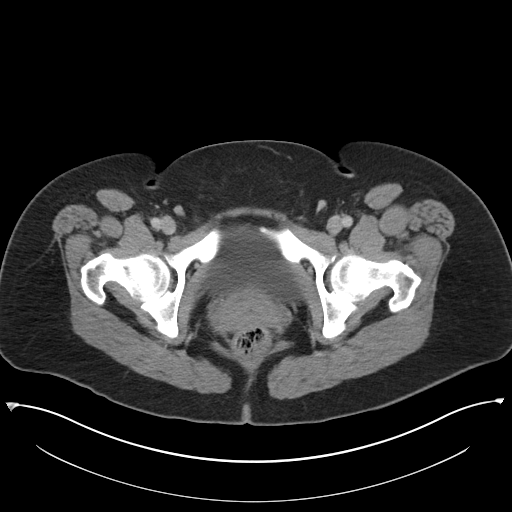
[im 25/102  soft-tissue]
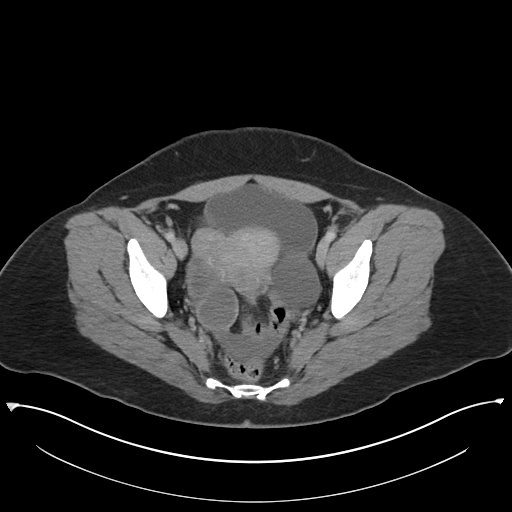
[im 29/102  soft-tissue]
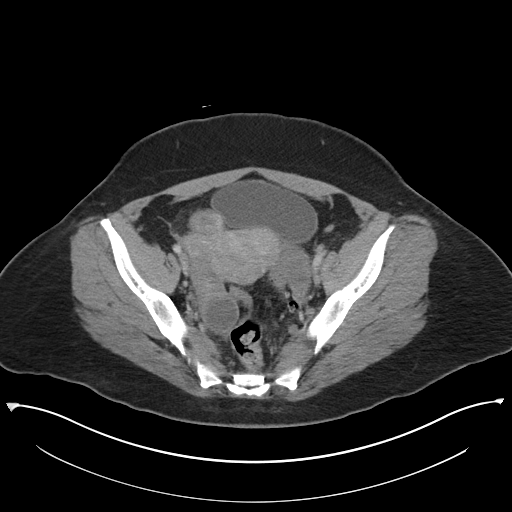
[im 39/102  soft-tissue]
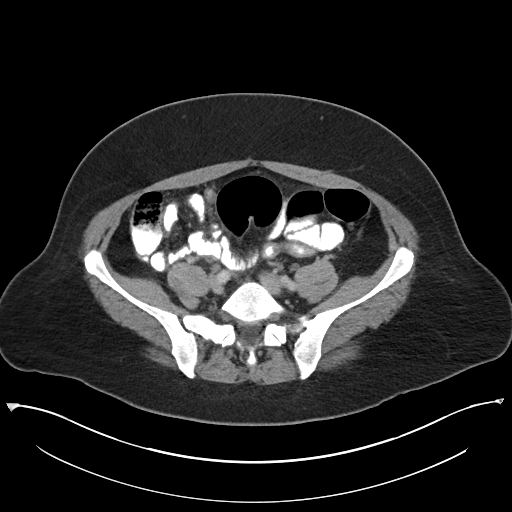
[im 49/102  soft-tissue]
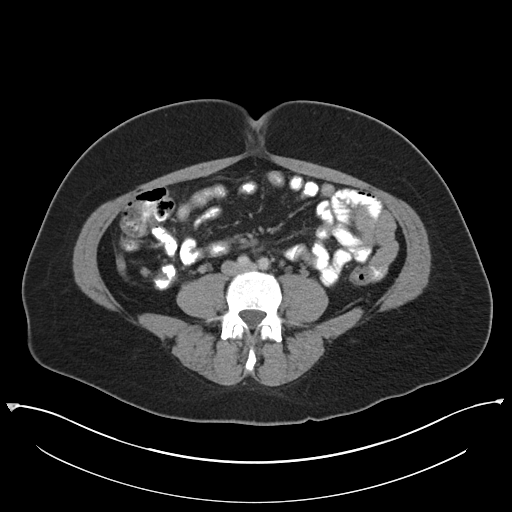
[im 53/102  soft-tissue]
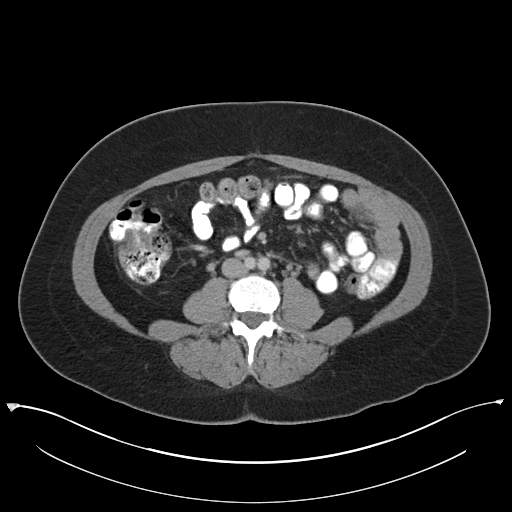
[im 63/102  soft-tissue]
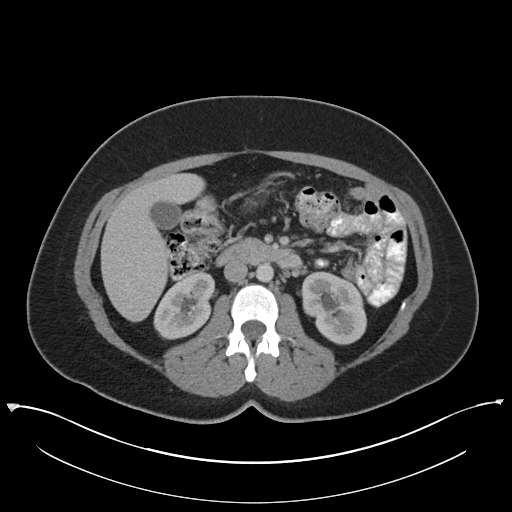
[im 73/102  soft-tissue]
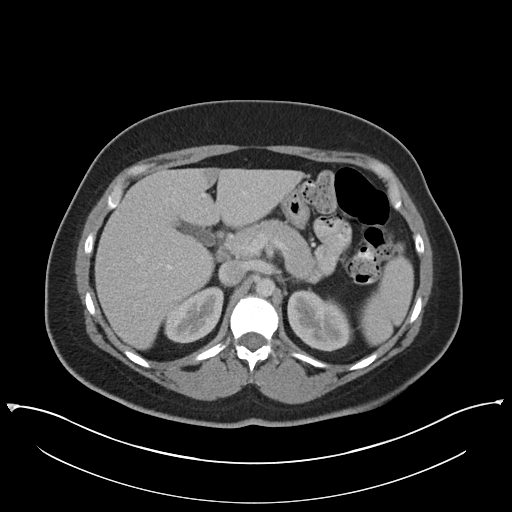
[im 73/102  bone]
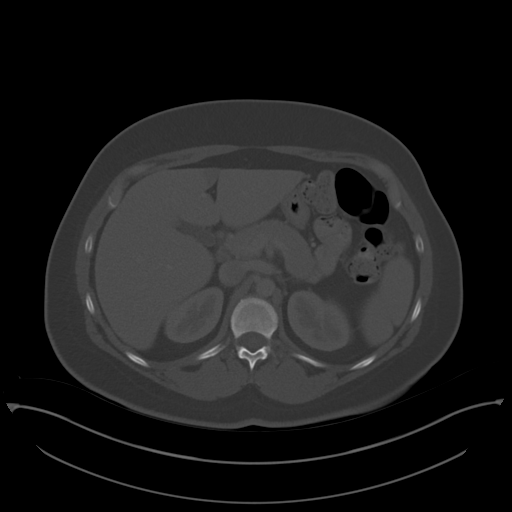
[im 77/102  soft-tissue]
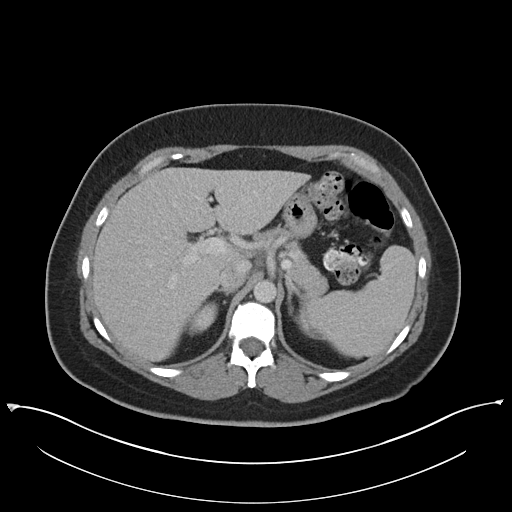
[im 87/102  soft-tissue]
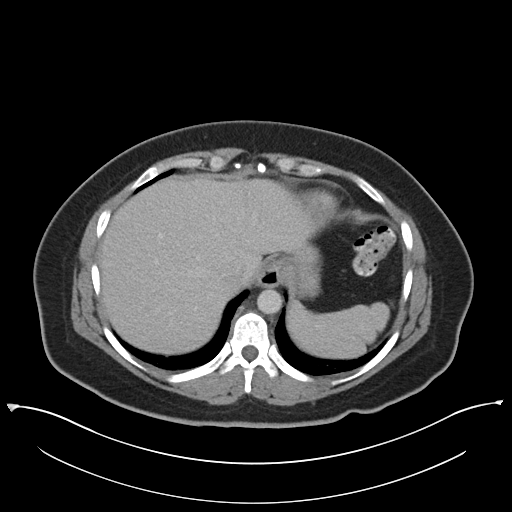
[im 97/102  soft-tissue]
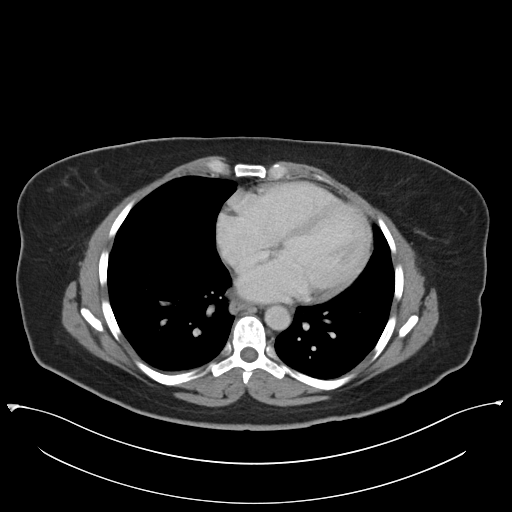

[Series 5: coronals · coronal · 0.79mm/px · 3 of 151 slices shown]
[im 51/151  soft-tissue]
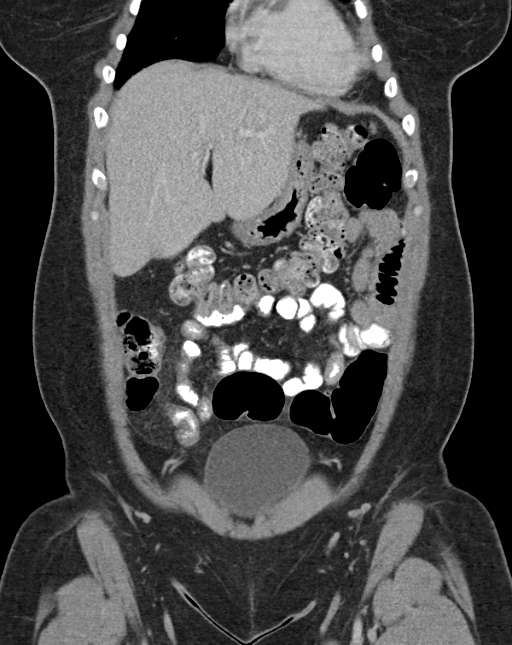
[im 67/151  soft-tissue]
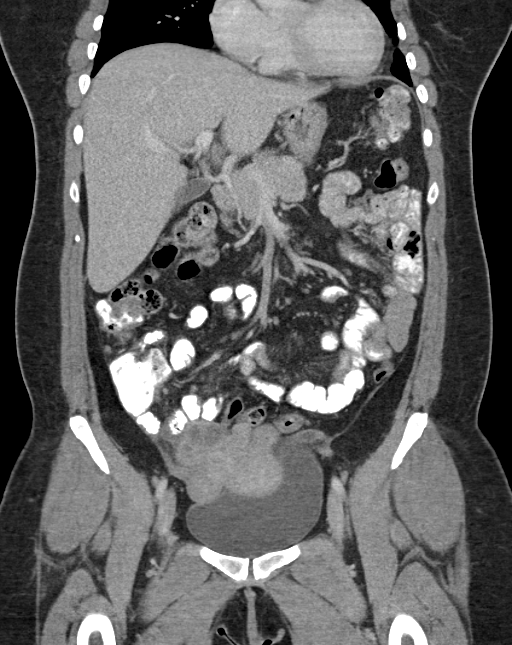
[im 84/151  soft-tissue]
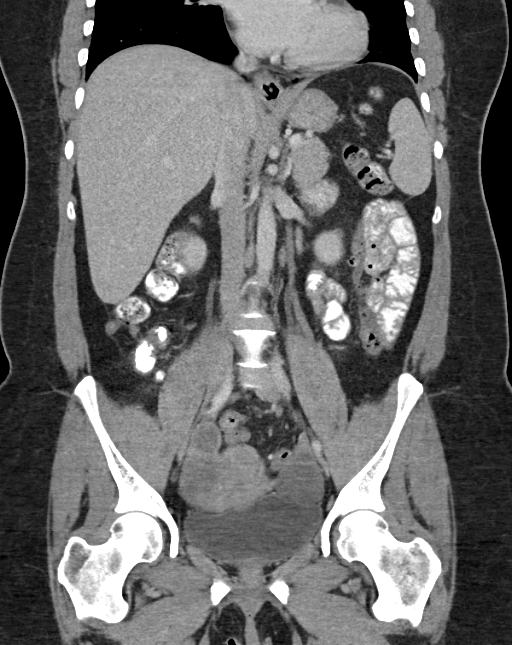

[15 of 46 positions shown; findings below may reference images not displayed]

FINDINGS: Lung bases are unremarkable. Sagittal images of the spine are
unremarkable.

Enhanced liver is unremarkable. No calcified gallstones are noted
within gallbladder. Enhanced pancreas, spleen and adrenal glands are
unremarkable.

Enhanced kidneys are symmetrical in size. No hydronephrosis or
hydroureter. No aortic aneurysm.

Delayed renal images shows bilateral renal symmetrical excretion.
Bilateral visualized proximal ureter is unremarkable.

There is no small bowel obstruction. No ascites or free air. No
adenopathy.

No thickened or dilated small bowel loops. There is no pericecal
inflammation. Normal retrocecal appendix noted in axial image 54.

There is mild nodular prominence of left anterior uterus fundus
measures 3.4 cm highly suspicious for a myometrial fibroid.
Correlation with clinical exam and pelvic ultrasound is recommended.
There is a left ovarian cyst measures 5 x 3.9 cm. Small amount of
free fluid noted within posterior cul-de-sac. There might be a
hemorrhagic follicle within right ovary measures 1.9 cm. There is
tubular dilatation of the right fallopian tube measures at least
cm. Findings are highly suspicious for hydrosalpinx. Minimal
enhancement of right tubal wall. Mild salpingitis cannot be
excluded. Clinical correlation is necessary.

No distal colonic obstruction.
IMPRESSION: 1. No acute inflammatory process within abdomen.
2. No small bowel obstruction.  No hydronephrosis or hydroureter.
3. Normal appendix.  No pericecal inflammation.
4. There is mild prominence of left uterine fundus measures 3.4 cm
highly suspicious for myometrial fibroid. Correlation with clinical
exam and pelvic ultrasound is recommended.
5. There is a left ovarian cyst measures 3.9 x 5 cm. Small amount of
free fluid noted posterior cul-de-sac. There is tubular dilatation
of right fallopian tube up to 2.7 cm. This is highly suspicious for
hydrosalpinx. Minimal prominence of right fallopian wall. Mild
salpingitis cannot be excluded. Clinical correlation is necessary.
Question hemorrhagic follicle within right ovary measures 1.9 cm.

## 2016-11-28 ENCOUNTER — Other Ambulatory Visit: Payer: Self-pay | Admitting: Obstetrics and Gynecology

## 2016-11-28 DIAGNOSIS — N6310 Unspecified lump in the right breast, unspecified quadrant: Secondary | ICD-10-CM

## 2017-02-12 IMAGING — CR DG THORACIC SPINE 2V
3 series · 3 of 3 positions shown · non-contrast
Comparison: CT abdomen/ pelvis 01/07/2015

CLINICAL DATA: Back pain after lifting a heavy object

EXAM:
THORACIC SPINE 2 VIEWS

[lateral (1 of 2)]
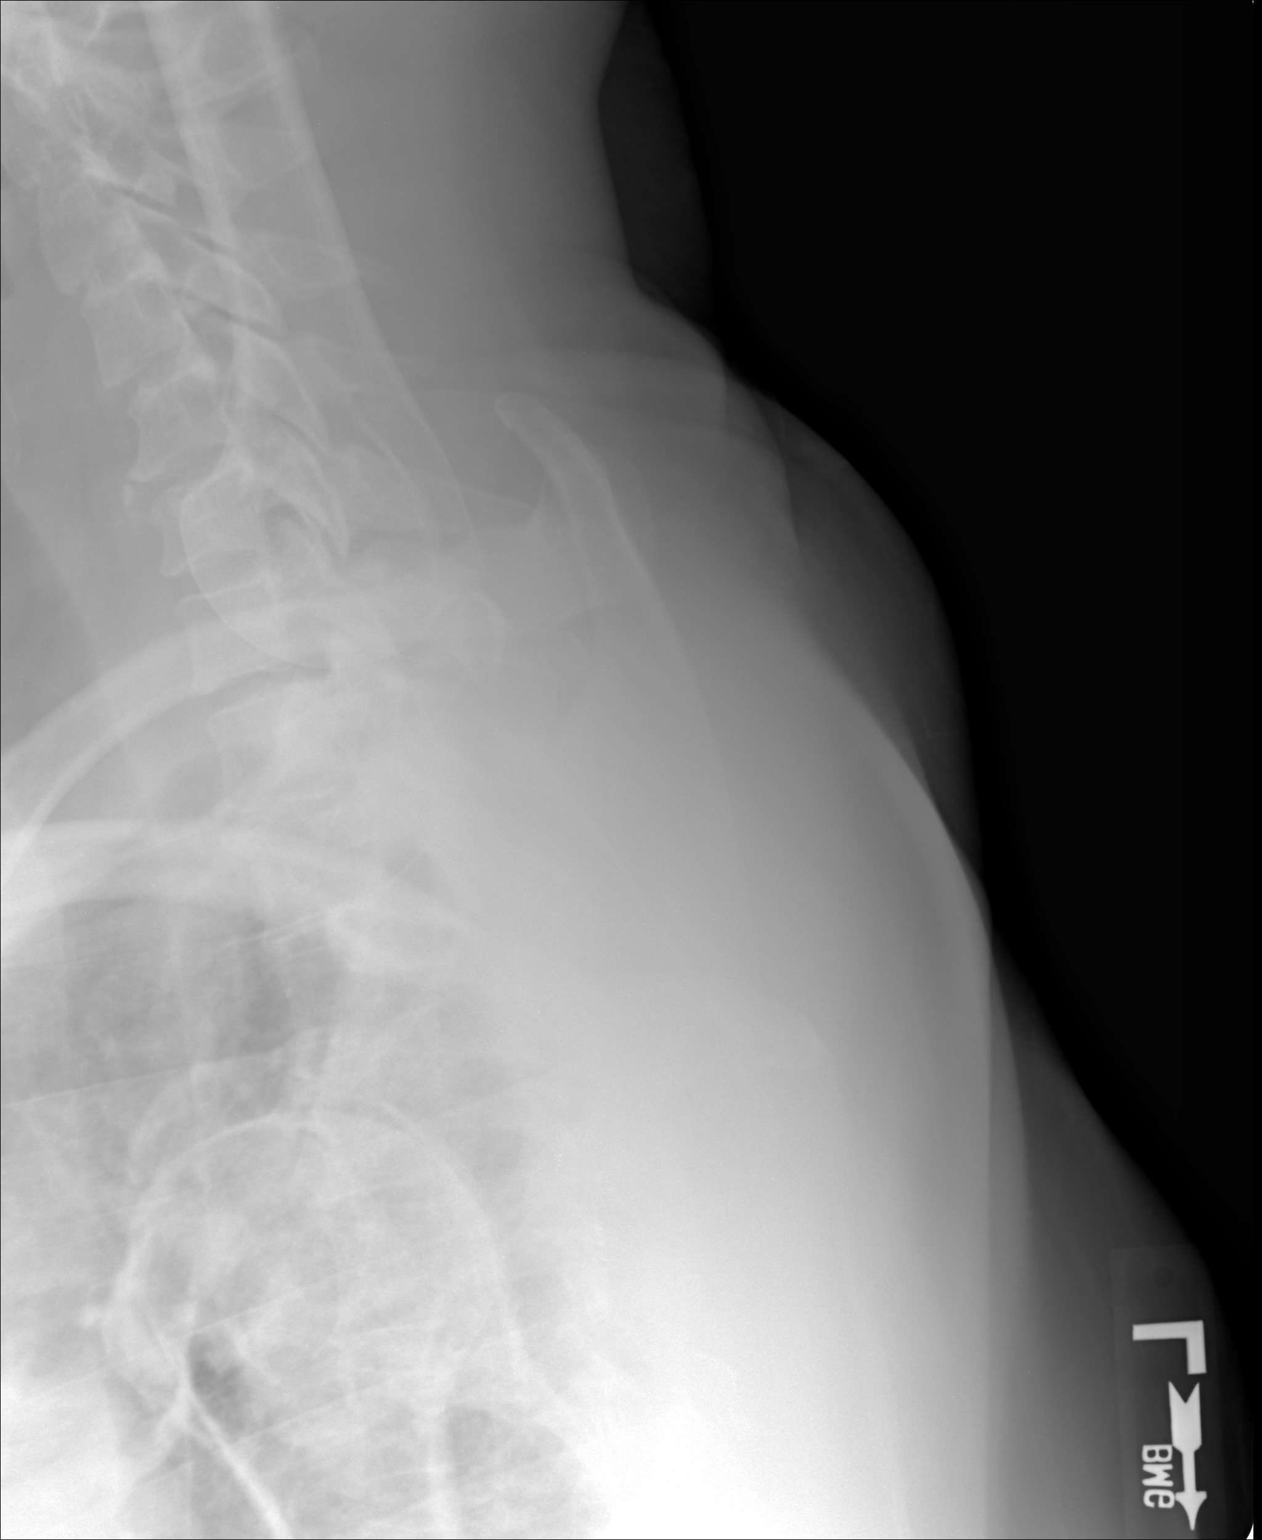

[lateral (2 of 2)]
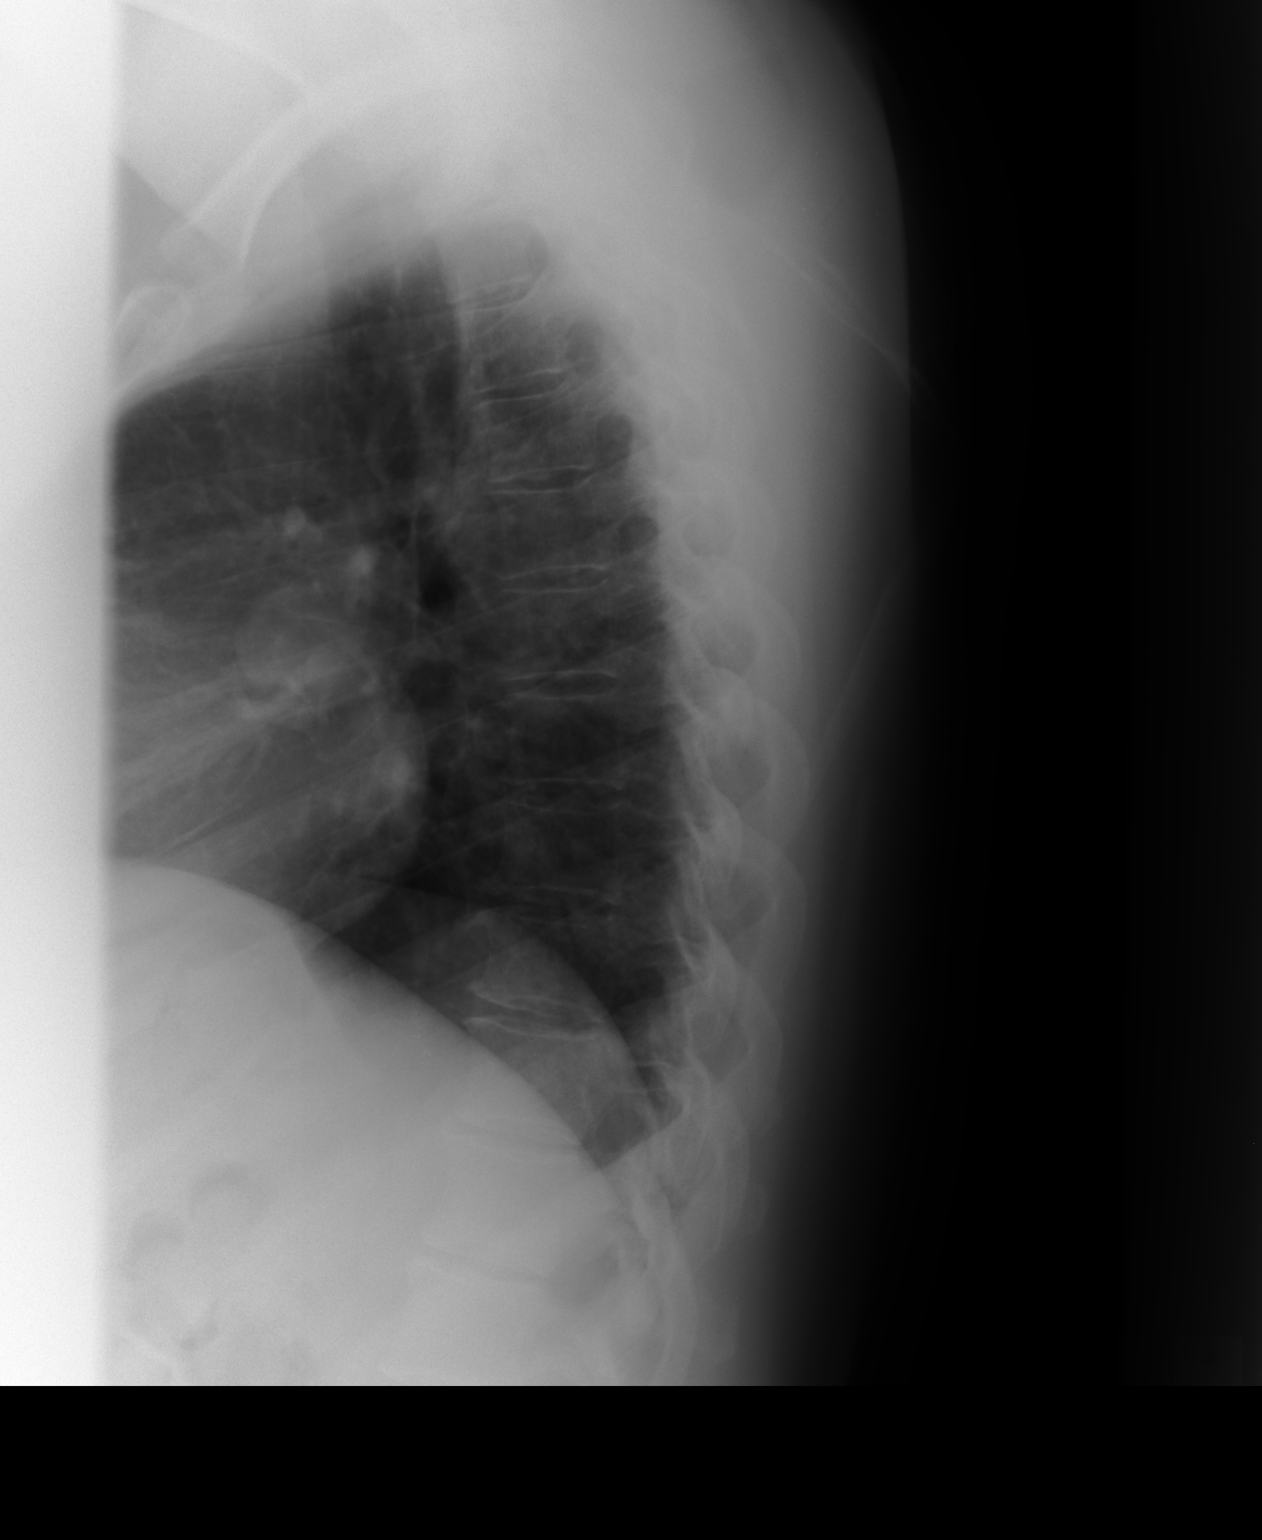

[AP]
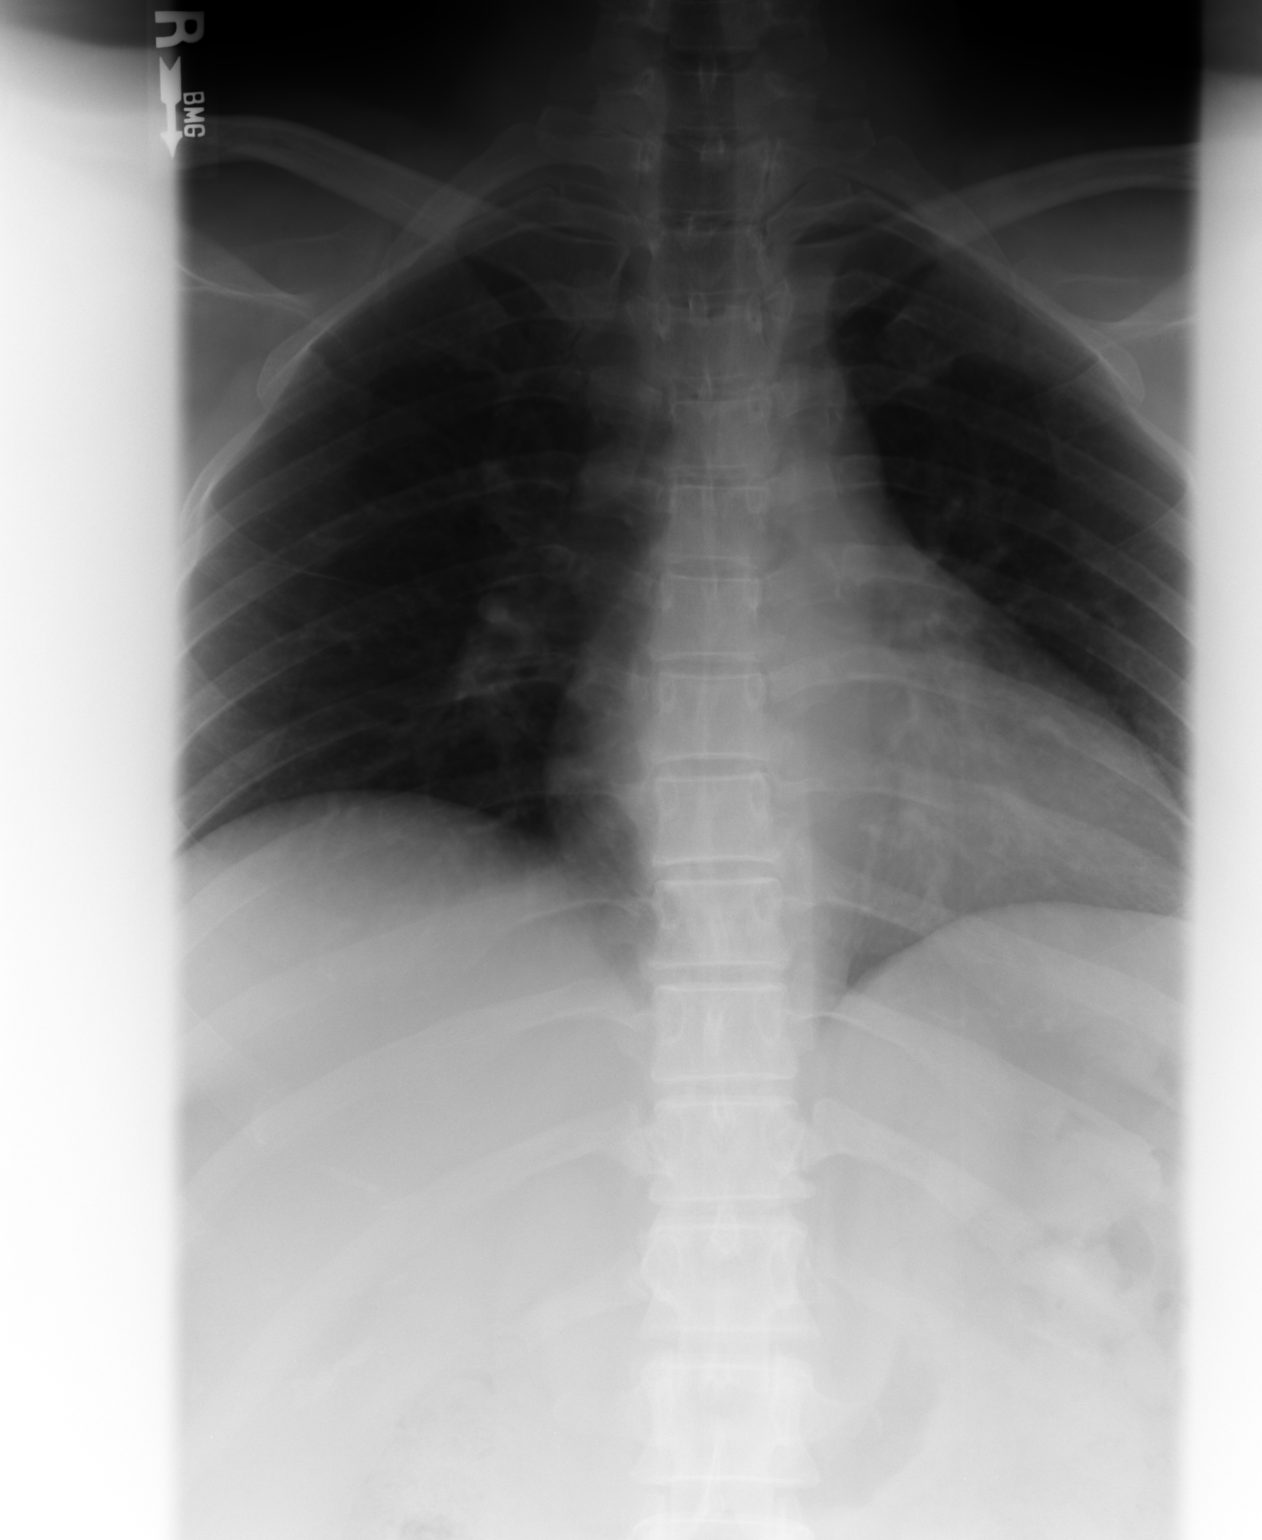

[3 of 3 positions shown; findings below may reference images not displayed]

FINDINGS: There is no evidence of thoracic spine fracture. Alignment is
normal. No other significant bone abnormalities are identified.
IMPRESSION: Negative.
